# Patient Record
Sex: Female | Born: 1995 | Race: Black or African American | Hispanic: No | Marital: Single
Health system: Southern US, Community
[De-identification: ages and names within clinical notes are randomized; demographics above are authoritative.]

## PROBLEM LIST (undated history)

## (undated) DIAGNOSIS — J45909 Unspecified asthma, uncomplicated: Secondary | ICD-10-CM

## (undated) DIAGNOSIS — F329 Major depressive disorder, single episode, unspecified: Secondary | ICD-10-CM

## (undated) DIAGNOSIS — R519 Headache, unspecified: Secondary | ICD-10-CM

## (undated) DIAGNOSIS — F988 Other specified behavioral and emotional disorders with onset usually occurring in childhood and adolescence: Secondary | ICD-10-CM

## (undated) DIAGNOSIS — F32A Depression, unspecified: Secondary | ICD-10-CM

## (undated) DIAGNOSIS — F4310 Post-traumatic stress disorder, unspecified: Secondary | ICD-10-CM

## (undated) DIAGNOSIS — R413 Other amnesia: Secondary | ICD-10-CM

## (undated) DIAGNOSIS — R454 Irritability and anger: Secondary | ICD-10-CM

## (undated) DIAGNOSIS — D649 Anemia, unspecified: Secondary | ICD-10-CM

## (undated) DIAGNOSIS — S0990XA Unspecified injury of head, initial encounter: Secondary | ICD-10-CM

## (undated) DIAGNOSIS — F909 Attention-deficit hyperactivity disorder, unspecified type: Secondary | ICD-10-CM

## (undated) DIAGNOSIS — R51 Headache: Secondary | ICD-10-CM

## (undated) DIAGNOSIS — F419 Anxiety disorder, unspecified: Secondary | ICD-10-CM

## (undated) HISTORY — DX: Headache: R51

## (undated) HISTORY — DX: Headache, unspecified: R51.9

---
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
## 2015-03-09 ENCOUNTER — Encounter (HOSPITAL_COMMUNITY): Payer: Self-pay | Admitting: Emergency Medicine

## 2015-03-09 ENCOUNTER — Emergency Department (HOSPITAL_COMMUNITY)
Admission: EM | Admit: 2015-03-09 | Discharge: 2015-03-09 | Disposition: A | Discharge status: Home / Self Care | Payer: BLUE CROSS/BLUE SHIELD | Attending: Emergency Medicine | Admitting: Emergency Medicine

## 2015-03-09 DIAGNOSIS — Z87828 Personal history of other (healed) physical injury and trauma: Secondary | ICD-10-CM | POA: Insufficient documentation

## 2015-03-09 DIAGNOSIS — R0789 Other chest pain: Secondary | ICD-10-CM | POA: Diagnosis not present

## 2015-03-09 DIAGNOSIS — Z7951 Long term (current) use of inhaled steroids: Secondary | ICD-10-CM | POA: Insufficient documentation

## 2015-03-09 DIAGNOSIS — Z79899 Other long term (current) drug therapy: Secondary | ICD-10-CM | POA: Diagnosis not present

## 2015-03-09 DIAGNOSIS — F329 Major depressive disorder, single episode, unspecified: Secondary | ICD-10-CM | POA: Insufficient documentation

## 2015-03-09 DIAGNOSIS — F419 Anxiety disorder, unspecified: Secondary | ICD-10-CM | POA: Insufficient documentation

## 2015-03-09 DIAGNOSIS — F909 Attention-deficit hyperactivity disorder, unspecified type: Secondary | ICD-10-CM | POA: Insufficient documentation

## 2015-03-09 DIAGNOSIS — Z792 Long term (current) use of antibiotics: Secondary | ICD-10-CM | POA: Insufficient documentation

## 2015-03-09 DIAGNOSIS — Z862 Personal history of diseases of the blood and blood-forming organs and certain disorders involving the immune mechanism: Secondary | ICD-10-CM | POA: Diagnosis not present

## 2015-03-09 DIAGNOSIS — R079 Chest pain, unspecified: Secondary | ICD-10-CM | POA: Diagnosis present

## 2015-03-09 DIAGNOSIS — J45901 Unspecified asthma with (acute) exacerbation: Secondary | ICD-10-CM | POA: Insufficient documentation

## 2015-03-09 HISTORY — DX: Other amnesia: R41.3

## 2015-03-09 HISTORY — DX: Post-traumatic stress disorder, unspecified: F43.10

## 2015-03-09 HISTORY — DX: Unspecified injury of head, initial encounter: S09.90XA

## 2015-03-09 MED ORDER — ACETAMINOPHEN 325 MG PO TABS
650.0000 mg | ORAL_TABLET | Freq: Once | ORAL | Status: AC
  Administered 2015-03-09: 650 mg via ORAL
  Filled 2015-03-09: qty 2

## 2015-03-09 NOTE — ED Notes (Signed)
Patient reports chest pain is worse.  Patient reports she did smoke cigarette. Notified PA.
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
## 2015-03-09 NOTE — ED Provider Notes (Signed)
CSN: [id number]     Arrival date & time 03/09/15  0505 History   First MD Initiated Contact with Patient 03/09/15 0507     Chief Complaint  Patient presents with  . Chest Pain     (Consider location/radiation/quality/duration/timing/severity/associated sxs/prior Treatment) Patient is a 20 y.o. female presenting with chest pain. The history is provided by the patient and a parent. No language interpreter was used.  Chest Pain Pain location:  Substernal area Pain quality: aching   Pain radiates to the back: no   Associated symptoms: headache and shortness of breath   Associated symptoms: no abdominal pain, no fever, no nausea and not vomiting   Associated symptoms comment:  Patient reports onset of SOB with bilateral lower chest pain that started tonight when exposed to second hand smoke. She used her inhaler multiple times but reports it made it worse. No fever or cough. No passing out. She complains of headache. No vomiting.   Past Medical History  Diagnosis Date  . Depression   . Anger   . ADD (attention deficit disorder)   . Anemia   . Anxiety   . ADHD (attention deficit hyperactivity disorder)   . Asthma   . Headache   . PTSD (post-traumatic stress disorder)   . Memory loss   . Head trauma    History reviewed. No pertinent past surgical history. Family History  Problem Relation Age of Onset  . High blood pressure Mother   . High blood pressure Father   . Cancer Maternal Grandmother    Social History  Substance Use Topics  . Smoking status: Passive Smoke Exposure - Never Smoker  . Smokeless tobacco: Never Used     Comment: Step dad smokes  . Alcohol Use: No   OB History    Gravida Para Term Preterm AB TAB SAB Ectopic Multiple Living   0 0 0 0 0 0 0 0       Review of Systems  Constitutional: Negative for fever and chills.  HENT: Negative for congestion.   Respiratory: Positive for shortness of breath and wheezing.   Cardiovascular: Positive for chest pain.   Gastrointestinal: Negative.  Negative for nausea, vomiting and abdominal pain.  Musculoskeletal: Negative.  Negative for myalgias.  Neurological: Positive for headaches.      Allergies  Ethyl alcohol (skin cleanser); Other; and Tomato  Home Medications   Prior to Admission medications   Medication Sig Start Date End Date Taking? Authorizing Provider  albuterol (PROVENTIL HFA;VENTOLIN HFA) 108 (90 BASE) MCG/ACT inhaler Inhale 2 puffs into the lungs every 6 (six) hours as needed for wheezing or shortness of breath. 11/24/13   Shruti Anderson Malta, MD  beclomethasone (QVAR) 80 MCG/ACT inhaler Inhale 2 puffs into the lungs 2 (two) times daily. 11/24/13   Shruti Anderson Malta, MD  cetirizine (ZYRTEC) 10 MG tablet Take 1 tablet (10 mg total) by mouth daily. 12/23/13   Shruti Anderson Malta, MD  diclofenac (VOLTAREN) 75 MG EC tablet TK 1 T PO BID WF PRN 12/16/14   Historical Provider, MD  lamoTRIgine (LAMICTAL) 25 MG tablet  12/16/14   Historical Provider, MD  methylphenidate 36 MG PO CR tablet TK 2 TS PO QAM FOR ADHD 11/18/14   Historical Provider, MD  mupirocin ointment (BACTROBAN) 2 % Apply 1 application topically 3 (three) times daily. 12/01/14   Martinique Broman-Fulks, MD  ondansetron (ZOFRAN) 4 MG tablet Take 1 tablet (4 mg total) by mouth every 8 (eight) hours as needed for nausea or  vomiting. 12/12/14   Clayton Bibles, PA-C  traZODone (DESYREL) 50 MG tablet TK 1 TO 2 TS PO AT BEDTIME PRF SLEEP 12/16/14   Historical Provider, MD   BP 122/96 mmHg  Pulse 76  Temp(Src) 98.5 F (36.9 C) (Oral)  Resp 20  Wt 96.2 kg  SpO2 100% Physical Exam  Constitutional: She is oriented to person, place, and time. She appears well-developed and well-nourished. No distress.  HENT:  Head: Normocephalic.  Mouth/Throat: Oropharynx is clear and moist.  Eyes: Conjunctivae are normal.  Neck: Normal range of motion.  Cardiovascular: Normal rate.   No murmur heard. Pulmonary/Chest: Effort normal. She has no wheezes. She has no  rales. She exhibits tenderness (bilateral lower chest and substernal chest tenderness that is mild. ).  Abdominal: Soft. There is no tenderness. There is no rebound.  Musculoskeletal: Normal range of motion. She exhibits no edema.  Neurological: She is alert and oriented to person, place, and time. Coordination normal.  Skin: Skin is dry.    ED Course  Procedures (including critical care time) Labs Review Labs Reviewed - No data to display  Imaging Review No results found. I have personally reviewed and evaluated these images and lab results as part of my medical decision-making.   EKG Interpretation None      MDM   Final diagnoses:  None    1. SOB 2. Headache  Chart reviewed. Patient is treated for daily headaches per well documented history. No neurologic deficits. No further evaluation for headache necessary emergently.  The patient has normal vital signs, including 100% O2 saturation, no tachycardia or tachypnea. She is very comfortable appearing. Suspect component of anxiety. Will keep her in ED for period of observation prior to discharge for any deterioration.    Charlann Lange, PA-C 03/09/15 [id number]  Julianne Rice, MD 03/09/15 [phone]

## 2015-03-09 NOTE — Discharge Instructions (Signed)

## 2015-04-11 ENCOUNTER — Telehealth: Payer: Self-pay

## 2015-04-11 NOTE — Telephone Encounter (Signed)
Patient called stating that she needs a letter for school stating that she no longer has migraines. She states that this is very urgent.  [phone] or [phone]

## 2015-04-11 NOTE — Telephone Encounter (Signed)
Patient called back with the information that was requested.  [phone] or [place]  Email:[email or web address]   [address] [place], [place] [address]

## 2015-04-12 ENCOUNTER — Encounter: Payer: Self-pay | Admitting: Pediatrics

## 2015-04-13 ENCOUNTER — Emergency Department (HOSPITAL_COMMUNITY)
Admission: EM | Admit: 2015-04-13 | Discharge: 2015-04-13 | Disposition: A | Discharge status: Home / Self Care | Payer: BLUE CROSS/BLUE SHIELD | Attending: Emergency Medicine | Admitting: Emergency Medicine

## 2015-04-13 ENCOUNTER — Encounter (HOSPITAL_COMMUNITY): Payer: Self-pay

## 2015-04-13 DIAGNOSIS — Z862 Personal history of diseases of the blood and blood-forming organs and certain disorders involving the immune mechanism: Secondary | ICD-10-CM | POA: Diagnosis not present

## 2015-04-13 DIAGNOSIS — J45909 Unspecified asthma, uncomplicated: Secondary | ICD-10-CM | POA: Diagnosis not present

## 2015-04-13 DIAGNOSIS — Z792 Long term (current) use of antibiotics: Secondary | ICD-10-CM | POA: Insufficient documentation

## 2015-04-13 DIAGNOSIS — Z87828 Personal history of other (healed) physical injury and trauma: Secondary | ICD-10-CM | POA: Diagnosis not present

## 2015-04-13 DIAGNOSIS — F329 Major depressive disorder, single episode, unspecified: Secondary | ICD-10-CM | POA: Diagnosis not present

## 2015-04-13 DIAGNOSIS — J069 Acute upper respiratory infection, unspecified: Secondary | ICD-10-CM | POA: Insufficient documentation

## 2015-04-13 DIAGNOSIS — F909 Attention-deficit hyperactivity disorder, unspecified type: Secondary | ICD-10-CM | POA: Diagnosis not present

## 2015-04-13 DIAGNOSIS — F419 Anxiety disorder, unspecified: Secondary | ICD-10-CM | POA: Insufficient documentation

## 2015-04-13 DIAGNOSIS — Z79899 Other long term (current) drug therapy: Secondary | ICD-10-CM | POA: Insufficient documentation

## 2015-04-13 DIAGNOSIS — Z7951 Long term (current) use of inhaled steroids: Secondary | ICD-10-CM | POA: Diagnosis not present

## 2015-04-13 DIAGNOSIS — F4310 Post-traumatic stress disorder, unspecified: Secondary | ICD-10-CM | POA: Diagnosis not present

## 2015-04-13 DIAGNOSIS — R05 Cough: Secondary | ICD-10-CM | POA: Diagnosis present

## 2015-04-13 MED ORDER — AZITHROMYCIN 250 MG PO TABS: 250.0000 mg | ORAL_TABLET | Freq: Every day | ORAL | Status: DC

## 2015-04-13 MED ORDER — LORATADINE 10 MG PO TABS: 10.0000 mg | ORAL_TABLET | Freq: Every day | ORAL | Status: DC

## 2015-04-13 MED ORDER — BENZONATATE 100 MG PO CAPS: 200.0000 mg | ORAL_CAPSULE | Freq: Two times a day (BID) | ORAL | Status: DC | PRN

## 2015-04-13 NOTE — ED Provider Notes (Signed)
History  By signing my name below, I, Marlowe Kays, attest that this documentation has been prepared under the direction and in the presence of Rob [place], [place]. Electronically Signed: Marlowe Kays, ED Scribe. 04/13/2015. 10:37 AM.  Chief Complaint  Patient presents with  . Cough  . Sore Throat   The history is provided by the patient and medical records. No language interpreter was used.    HPI Comments:  Tina Hale is a 20 y.o. obese female who presents to the Emergency Department complaining of cough that began two months ago. She reports associated subjective fever, rhinorrhea and sore throat. Pt states she came to be seen today because her teacher suggested she be checked out. She has not taken anything for her symptoms but states she takes Zyrtec daily for allergies. She denies modifying factors. She denies nausea, vomiting. She states her PCP is Designer, [facility]/clothing.  Past Medical History  Diagnosis Date  . Depression   . Anger   . ADD (attention deficit disorder)   . Anemia   . Anxiety   . ADHD (attention deficit hyperactivity disorder)   . Asthma   . Headache   . PTSD (post-traumatic stress disorder)   . Memory loss   . Head trauma    History reviewed. No pertinent past surgical history. Family History  Problem Relation Age of Onset  . High blood pressure Mother   . High blood pressure Father   . Cancer Maternal Grandmother    Social History  Substance Use Topics  . Smoking status: Passive Smoke Exposure - Never Smoker  . Smokeless tobacco: Never Used     Comment: Step dad smokes  . Alcohol Use: No   OB History    Gravida Para Term Preterm AB TAB SAB Ectopic Multiple Living   0 0 0 0 0 0 0 0       Review of Systems  Constitutional: Positive for fever (subjective).  HENT: Positive for congestion and sore throat.   Respiratory: Positive for cough.   Gastrointestinal: Negative for nausea and vomiting.    Allergies  Ethyl alcohol (skin cleanser);  Other; and Tomato  Home Medications   Prior to Admission medications   Medication Sig Start Date End Date Taking? Authorizing Provider  albuterol (PROVENTIL HFA;VENTOLIN HFA) 108 (90 BASE) MCG/ACT inhaler Inhale 2 puffs into the lungs every 6 (six) hours as needed for wheezing or shortness of breath. 11/24/13   Shruti Anderson Malta, MD  beclomethasone (QVAR) 80 MCG/ACT inhaler Inhale 2 puffs into the lungs 2 (two) times daily. 11/24/13   Shruti Anderson Malta, MD  cetirizine (ZYRTEC) 10 MG tablet Take 1 tablet (10 mg total) by mouth daily. 12/23/13   Shruti Anderson Malta, MD  diclofenac (VOLTAREN) 75 MG EC tablet TK 1 T PO BID WF PRN 12/16/14   Historical Provider, MD  lamoTRIgine (LAMICTAL) 25 MG tablet  12/16/14   Historical Provider, MD  methylphenidate 36 MG PO CR tablet TK 2 TS PO QAM FOR ADHD 11/18/14   Historical Provider, MD  mupirocin ointment (BACTROBAN) 2 % Apply 1 application topically 3 (three) times daily. 12/01/14   Martinique Broman-Fulks, MD  ondansetron (ZOFRAN) 4 MG tablet Take 1 tablet (4 mg total) by mouth every 8 (eight) hours as needed for nausea or vomiting. 12/12/14   Clayton Bibles, PA-C  traZODone (DESYREL) 50 MG tablet TK 1 TO 2 TS PO AT BEDTIME PRF SLEEP 12/16/14   Historical Provider, MD   Triage Vitals: BP 112/50 mmHg  Pulse  91  Temp(Src) 98.2 F (36.8 C) (Oral)  Resp 16  Ht 5\' 5"  (1.651 m)  Wt 230 lb (104.327 kg)  BMI 38.27 kg/m2  SpO2 100% Physical Exam Physical Exam  Constitutional: Pt  is oriented to person, place, and time. Appears well-developed and well-nourished. No distress.  HENT:  Head: Normocephalic and atraumatic.  Right Ear: Tympanic membrane, external ear and ear canal normal.  Left Ear: Tympanic membrane, external ear and ear canal normal.  Nose: Mucosal edema and mild rhinorrhea present. No epistaxis. Right sinus exhibits no maxillary sinus tenderness and no frontal sinus tenderness. Left sinus exhibits no maxillary sinus tenderness and no frontal sinus tenderness.   Mouth/Throat: Uvula is midline and mucous membranes are normal. Mucous membranes are not pale and not cyanotic. No oropharyngeal exudate, posterior oropharyngeal edema, posterior oropharyngeal erythema or tonsillar abscesses.  Eyes: Conjunctivae are normal. Pupils are equal, round, and reactive to light.  Neck: Normal range of motion and full passive range of motion without pain.  Cardiovascular: Normal rate and intact distal pulses.   Pulmonary/Chest: Effort normal and breath sounds normal. No stridor.  Clear and equal breath sounds without focal wheezes, rhonchi, rales  Abdominal: Soft. Bowel sounds are normal. There is no tenderness.  Musculoskeletal: Normal range of motion.  Lymphadenopathy:    Pthas no cervical adenopathy.  Neurological: Pt is alert and oriented to person, place, and time.  Skin: Skin is warm and dry. No rash noted. Pt is not diaphoretic.  Psychiatric: Normal mood and affect.  Nursing note and vitals reviewed.   ED Course  Procedures (including critical care time) DIAGNOSTIC STUDIES: Oxygen Saturation is 100% on RA, normal by my interpretation.   COORDINATION OF CARE: 10:33 AM- Will prescribe Z-Pak and cough medication. Will prescribe Loratadine. Pt verbalizes understanding and agrees to plan.  Medications - No data to display   MDM   Final diagnoses:  URI (upper respiratory infection)     Patients symptoms are consistent with URI, likely viral etiology. Discussed that antibiotics are not indicated for viral infections, but given length of symptoms she may benefit from antibiotic trial. Pt will be discharged with symptomatic treatment.  Verbalizes understanding and is agreeable with plan. Pt is hemodynamically stable & in NAD prior to dc.   I personally performed the services described in this documentation, which was scribed in my presence. The recorded information has been reviewed and is accurate.       Montine Circle, PA-C 04/13/15  [place], MD 04/13/15 1538

## 2015-04-13 NOTE — Discharge Instructions (Signed)
Upper Respiratory Infection, Adult Most upper respiratory infections (URIs) are a viral infection of the air passages leading to the lungs. A URI affects the nose, throat, and upper air passages. The most common type of URI is nasopharyngitis and is typically referred to as "the common cold." URIs run their course and usually go away on their own. Most of the time, a URI does not require medical attention, but sometimes a bacterial infection in the upper airways can follow a viral infection. This is called a secondary infection. Sinus and middle ear infections are common types of secondary upper respiratory infections. Bacterial pneumonia can also complicate a URI. A URI can worsen asthma and chronic obstructive pulmonary disease (COPD). Sometimes, these complications can require emergency medical care and may be life threatening.  CAUSES Almost all URIs are caused by viruses. A virus is a type of germ and can spread from one person to another.  RISKS FACTORS You may be at risk for a URI if:   You smoke.   You have chronic heart or lung disease.  You have a weakened defense (immune) system.   You are very young or very old.   You have nasal allergies or asthma.  You work in crowded or poorly ventilated areas.  You work in health care facilities or schools. SIGNS AND SYMPTOMS  Symptoms typically develop 2-3 days after you come in contact with a cold virus. Most viral URIs last 7-10 days. However, viral URIs from the influenza virus (flu virus) can last 14-18 days and are typically more severe. Symptoms may include:   Runny or stuffy (congested) nose.   Sneezing.   Cough.   Sore throat.   Headache.   Fatigue.   Fever.   Loss of appetite.   Pain in your forehead, behind your eyes, and over your cheekbones (sinus pain).  Muscle aches.  DIAGNOSIS  Your health care provider may diagnose a URI by:  Physical exam.  Tests to check that your symptoms are not due to  another condition such as:  Strep throat.  Sinusitis.  Pneumonia.  Asthma. TREATMENT  A URI goes away on its own with time. It cannot be cured with medicines, but medicines may be prescribed or recommended to relieve symptoms. Medicines may help:  Reduce your fever.  Reduce your cough.  Relieve nasal congestion. HOME CARE INSTRUCTIONS   Take medicines only as directed by your health care provider.   Gargle warm saltwater or take cough drops to comfort your throat as directed by your health care provider.  Use a warm mist humidifier or inhale steam from a shower to increase air moisture. This may make it easier to breathe.  Drink enough fluid to keep your urine clear or pale yellow.   Eat soups and other clear broths and maintain good nutrition.   Rest as needed.   Return to work when your temperature has returned to normal or as your health care provider advises. You may need to stay home longer to avoid infecting others. You can also use a face mask and careful hand washing to prevent spread of the virus.  Increase the usage of your inhaler if you have asthma.   Do not use any tobacco products, including cigarettes, chewing tobacco, or electronic cigarettes. If you need help quitting, ask your health care provider. PREVENTION  The best way to protect yourself from getting a cold is to practice good hygiene.   Avoid oral or hand contact with people with cold   symptoms.   Wash your hands often if contact occurs.  There is no clear evidence that vitamin C, vitamin E, echinacea, or exercise reduces the chance of developing a cold. However, it is always recommended to get plenty of rest, exercise, and practice good nutrition.  SEEK MEDICAL CARE IF:   You are getting worse rather than better.   Your symptoms are not controlled by medicine.   You have chills.  You have worsening shortness of breath.  You have brown or red mucus.  You have yellow or brown nasal  discharge.  You have pain in your face, especially when you bend forward.  You have a fever.  You have swollen neck glands.  You have pain while swallowing.  You have white areas in the back of your throat. SEEK IMMEDIATE MEDICAL CARE IF:   You have severe or persistent:  Headache.  Ear pain.  Sinus pain.  Chest pain.  You have chronic lung disease and any of the following:  Wheezing.  Prolonged cough.  Coughing up blood.  A change in your usual mucus.  You have a stiff neck.  You have changes in your:  Vision.  Hearing.  Thinking.  Mood. MAKE SURE YOU:   Understand these instructions.  Will watch your condition.  Will get help right away if you are not doing well or get worse.   This information is not intended to replace advice given to you by your health care provider. Make sure you discuss any questions you have with your health care provider.   Document Released: 06/26/2000 Document Revised: 05/17/2014 Document Reviewed: 04/07/2013 Elsevier Interactive Patient Education 2016 Elsevier Inc.  

## 2015-04-13 NOTE — ED Notes (Signed)
Patient heere with cough, congestion and sore throat for several days, no distress

## 2015-04-13 NOTE — ED Notes (Signed)
Declined W/C at D/C and was escorted to lobby by RN. 

## 2015-05-10 ENCOUNTER — Ambulatory Visit: Payer: BLUE CROSS/BLUE SHIELD | Admitting: Pediatrics

## 2015-05-12 ENCOUNTER — Ambulatory Visit: Payer: BLUE CROSS/BLUE SHIELD | Admitting: Pediatrics

## 2015-07-17 ENCOUNTER — Emergency Department (HOSPITAL_COMMUNITY)
Admission: EM | Admit: 2015-07-17 | Discharge: 2015-07-17 | Disposition: A | Discharge status: Home / Self Care | Payer: BLUE CROSS/BLUE SHIELD | Attending: Emergency Medicine | Admitting: Emergency Medicine

## 2015-07-17 ENCOUNTER — Emergency Department (HOSPITAL_COMMUNITY): Payer: BLUE CROSS/BLUE SHIELD

## 2015-07-17 ENCOUNTER — Encounter (HOSPITAL_COMMUNITY): Payer: Self-pay | Admitting: Emergency Medicine

## 2015-07-17 DIAGNOSIS — Z79899 Other long term (current) drug therapy: Secondary | ICD-10-CM | POA: Diagnosis not present

## 2015-07-17 DIAGNOSIS — Z7722 Contact with and (suspected) exposure to environmental tobacco smoke (acute) (chronic): Secondary | ICD-10-CM | POA: Diagnosis not present

## 2015-07-17 DIAGNOSIS — F329 Major depressive disorder, single episode, unspecified: Secondary | ICD-10-CM | POA: Diagnosis not present

## 2015-07-17 DIAGNOSIS — J020 Streptococcal pharyngitis: Secondary | ICD-10-CM | POA: Insufficient documentation

## 2015-07-17 DIAGNOSIS — J45909 Unspecified asthma, uncomplicated: Secondary | ICD-10-CM | POA: Diagnosis not present

## 2015-07-17 DIAGNOSIS — J029 Acute pharyngitis, unspecified: Secondary | ICD-10-CM | POA: Diagnosis present

## 2015-07-17 DIAGNOSIS — R040 Epistaxis: Secondary | ICD-10-CM

## 2015-07-17 LAB — I-STAT CHEM 8, ED
BUN: 10 mg/dL (ref 6–20)
Calcium, Ion: 1.22 mmol/L (ref 1.13–1.30)
Chloride: 105 mmol/L (ref 101–111)
Creatinine, Ser: 0.7 mg/dL (ref 0.44–1.00)
Glucose, Bld: 73 mg/dL (ref 65–99)
HCT: 37 % (ref 36.0–46.0)
Hemoglobin: 12.6 g/dL (ref 12.0–15.0)
Potassium: 4.6 mmol/L (ref 3.5–5.1)
Sodium: 141 mmol/L (ref 135–145)
TCO2: 27 mmol/L (ref 0–100)

## 2015-07-17 LAB — RAPID STREP SCREEN (MED CTR MEBANE ONLY): Streptococcus, Group A Screen (Direct): NEGATIVE

## 2015-07-17 MED ORDER — OXYMETAZOLINE HCL 0.05 % NA SOLN: 1.0000 | Freq: Two times a day (BID) | NASAL | Status: DC

## 2015-07-17 MED ORDER — DEXAMETHASONE SODIUM PHOSPHATE 10 MG/ML IJ SOLN
10.0000 mg | Freq: Once | INTRAMUSCULAR | Status: AC
  Administered 2015-07-17: 10 mg via INTRAMUSCULAR
  Filled 2015-07-17: qty 1

## 2015-07-17 NOTE — ED Notes (Signed)
Patient transported to X-ray 

## 2015-07-17 NOTE — ED Notes (Addendum)
Per EMS. Pt had a nosebleed yesterday that lasted an hour. Since then has had several episodes of emesis with bright red blood accompanied by bilateral upper abd pain. Pt also complains of throat discomfort

## 2015-07-17 NOTE — ED Notes (Signed)
Pt reports that she has not bee vomiting blood, but has been spitting up blood since nose bleed.  Pt c/o sore throat x 1 week.  Pain score 10/10.

## 2015-07-17 NOTE — ED Notes (Signed)
Bed: WTR6 Expected date:  Expected time:  Means of arrival:  Comments: 72F/hematemesis

## 2015-07-17 NOTE — Discharge Instructions (Signed)
Nosebleed Nosebleeds are common. They are due to a crack in the inside lining of your nose (mucous membrane) or from a small blood vessel that starts to bleed. Nosebleeds can be caused by many conditions, such as injury, infections, dry mucous membranes or dry climate, medicines, nose picking, and home heating and cooling systems. Most nosebleeds come from blood vessels in the front of your nose. HOME CARE INSTRUCTIONS   Try controlling your nosebleed by pinching your nostrils gently and continuously for at least 10 minutes.  Avoid blowing or sniffing your nose for a number of hours after having a nosebleed.  Do not put gauze inside your nose yourself. If your nose was packed by your health care provider, try to maintain the pack inside of your nose until your health care provider removes it.  If a gauze pack was used and it starts to fall out, gently replace it or cut off the end of it.  If a balloon catheter was used to pack your nose, do not cut or remove it unless your health care provider has instructed you to do that.  Avoid lying down while you are having a nosebleed. Sit up and lean forward.  Use a nasal spray decongestant to help with a nosebleed as directed by your health care provider.  Do not use petroleum jelly or mineral oil in your nose. These can drip into your lungs.  Maintain humidity in your home by using less air conditioning or by using a humidifier.  Aspirinand blood thinners make bleeding more likely. If you are prescribed these medicines and you suffer from nosebleeds, ask your health care provider if you should stop taking the medicines or adjust the dose. Do not stop medicines unless directed by your health care provider  Resume your normal activities as you are able, but avoid straining, lifting, or bending at the waist for several days.  If your nosebleed was caused by dry mucous membranes, use over-the-counter saline nasal spray or gel. This will keep the  mucous membranes moist and allow them to heal. If you must use a lubricant, choose the water-soluble variety. Use it only sparingly, and do not use it within several hours of lying down.  Keep all follow-up visits as directed by your health care provider. This is important. SEEK MEDICAL CARE IF:  You have a fever.  You get frequent nosebleeds.  You are getting nosebleeds more often. SEEK IMMEDIATE MEDICAL CARE IF:  Your nosebleed lasts longer than 20 minutes.  Your nosebleed occurs after an injury to your face, and your nose looks crooked or broken.  You have unusual bleeding from other parts of your body.  You have unusual bruising on other parts of your body.  You feel light-headed or you faint.  You become sweaty.  You vomit blood.  Your nosebleed occurs after a head injury.   This information is not intended to replace advice given to you by your health care provider. Make sure you discuss any questions you have with your health care provider.   Document Released: 10/10/2004 Document Revised: 01/21/2014 Document Reviewed: 08/16/2013 Elsevier Interactive Patient Education 2016 Elsevier Inc.  Sore Throat A sore throat is pain, burning, irritation, or scratchiness of the throat. There is often pain or tenderness when swallowing or talking. A sore throat may be accompanied by other symptoms, such as coughing, sneezing, fever, and swollen neck glands. A sore throat is often the first sign of another sickness, such as a cold, flu, strep throat,  or mononucleosis (commonly known as mono). Most sore throats go away without medical treatment. CAUSES  The most common causes of a sore throat include:  A viral infection, such as a cold, flu, or mono.  A bacterial infection, such as strep throat, tonsillitis, or whooping cough.  Seasonal allergies.  Dryness in the air.  Irritants, such as smoke or pollution.  Gastroesophageal reflux disease (GERD). HOME CARE INSTRUCTIONS    Only take over-the-counter medicines as directed by your caregiver.  Drink enough fluids to keep your urine clear or pale yellow.  Rest as needed.  Try using throat sprays, lozenges, or sucking on hard candy to ease any pain (if older than 4 years or as directed).  Sip warm liquids, such as broth, herbal tea, or warm water with honey to relieve pain temporarily. You may also eat or drink cold or frozen liquids such as frozen ice pops.  Gargle with salt water (mix 1 tsp salt with 8 oz of water).  Do not smoke and avoid secondhand smoke.  Put a cool-mist humidifier in your bedroom at night to moisten the air. You can also turn on a hot shower and sit in the bathroom with the door closed for 5-10 minutes. SEEK IMMEDIATE MEDICAL CARE IF:  You have difficulty breathing.  You are unable to swallow fluids, soft foods, or your saliva.  You have increased swelling in the throat.  Your sore throat does not get better in 7 days.  You have nausea and vomiting.  You have a fever or persistent symptoms for more than 2-3 days.  You have a fever and your symptoms suddenly get worse. MAKE SURE YOU:   Understand these instructions.  Will watch your condition.  Will get help right away if you are not doing well or get worse.   This information is not intended to replace advice given to you by your health care provider. Make sure you discuss any questions you have with your health care provider.   Your strep test today was negative. You do not need antibiotics. Your infection is likely viral in nature. Use Afrin for nosebleed. Continue home ibuprofen and tylenol for sore throat. Follow up with your primary care provider if your symptoms do not improve. Return to the ED if you experience difficulty breathing, difficulty swallowing, increased fevers, blood in vomit.

## 2015-07-18 NOTE — ED Provider Notes (Signed)
CSN: [id number]     Arrival date & time 07/17/15  1152 History   First MD Initiated Contact with Patient 07/17/15 1409     Chief Complaint  Patient presents with  . Hematemesis     (Consider location/radiation/quality/duration/timing/severity/associated sxs/prior Treatment) HPI   Tina Hale is a 20 year old here with a past medical history of anxiety, ADD who presents the ED today complaining of sore throat and nosebleed. Patient states that for the last week she has been experiencing URI symptoms including rhinorrhea, dry cough, sore throat. She states she has also felt hot at times but is not sure if she ran a fever. Patient states that her throat is very sore and she feels like there is something stuck in it. She reports that it is painful to swallow. Patient also states that yesterday she developed a severe nosebleed that bled for about one hour. During and after the nosebleed patient reports that when she coughed she coughed up some blood. Patient states that she has tried taking Advil and Tylenol for her symptoms without relief. She has also tried drinking hot tea and gargling with salt water without relief. She denies any vomiting, otalgia, difficulty breathing, difficulty swallowing.  Past Medical History  Diagnosis Date  . Depression   . Anger   . ADD (attention deficit disorder)   . Anemia   . Anxiety   . ADHD (attention deficit hyperactivity disorder)   . Asthma   . Headache   . PTSD (post-traumatic stress disorder)   . Memory loss   . Head trauma    History reviewed. No pertinent past surgical history. Family History  Problem Relation Age of Onset  . High blood pressure Mother   . High blood pressure Father   . Cancer Maternal Grandmother    Social History  Substance Use Topics  . Smoking status: Passive Smoke Exposure - Never Smoker  . Smokeless tobacco: Never Used     Comment: Step dad smokes  . Alcohol Use: No   OB History    Gravida Para Term Preterm AB TAB  SAB Ectopic Multiple Living   0 0 0 0 0 0 0 0       Review of Systems  All other systems reviewed and are negative.     Allergies  Ethyl alcohol (skin cleanser); Other; and Tomato  Home Medications   Prior to Admission medications   Medication Sig Start Date End Date Taking? Authorizing Provider  albuterol (PROVENTIL HFA;VENTOLIN HFA) 108 (90 BASE) MCG/ACT inhaler Inhale 2 puffs into the lungs every 6 (six) hours as needed for wheezing or shortness of breath. 11/24/13  Yes Ok Edwards, MD  beclomethasone (QVAR) 80 MCG/ACT inhaler Inhale 2 puffs into the lungs 2 (two) times daily. 11/24/13  Yes Shruti Anderson [place], MD  cetirizine (ZYRTEC) 10 MG tablet Take 1 tablet (10 mg total) by mouth daily. 12/23/13  Yes Ok Edwards, MD  lamoTRIgine (LAMICTAL) 100 MG tablet Take 100 mg by mouth daily. 07/11/15  Yes Historical Provider, MD  methylphenidate (RITALIN) 10 MG tablet Take 10 mg by mouth daily at 2 PM. 07/11/15  Yes Historical Provider, MD  methylphenidate 36 MG PO CR tablet Take 2 tablets (72mg ) by mouth every morning. 11/18/14  Yes Historical Provider, MD  traZODone (DESYREL) 50 MG tablet Take 50-100mg  by mouth at bedtime as needed for sleep 12/16/14  Yes Historical Provider, MD  azithromycin (ZITHROMAX) 250 MG tablet Take 1 tablet (250 mg total) by mouth daily. Take first  2 tablets together, then 1 every day until finished. Patient not taking: Reported on 07/17/2015 04/13/15   Montine Circle, PA-C  benzonatate (TESSALON) 100 MG capsule Take 2 capsules (200 mg total) by mouth 2 (two) times daily as needed for cough. Patient not taking: Reported on 07/17/2015 04/13/15   Montine Circle, PA-C  loratadine (CLARITIN) 10 MG tablet Take 1 tablet (10 mg total) by mouth daily. Patient not taking: Reported on 07/17/2015 04/13/15   Montine Circle, PA-C  oxymetazoline (AFRIN NASAL SPRAY) 0.05 % nasal spray Place 1 spray into both nostrils 2 (two) times daily. 07/17/15   Canton Yearby Tripp Brilee [place], PA-C   BP  108/72 mmHg  Pulse 80  Temp(Src) 97.7 F (36.5 C) (Oral)  Resp 16  SpO2 100% Physical Exam  Constitutional: She is oriented to person, place, and time. She appears well-developed and well-nourished. No distress.  HENT:  Head: Normocephalic and atraumatic.  Mouth/Throat: Mucous membranes are normal. No trismus in the jaw. No uvula swelling. Posterior oropharyngeal erythema present. No oropharyngeal exudate, posterior oropharyngeal edema or tonsillar abscesses.  Small amount of dried blood in left nare  Eyes: Conjunctivae are normal. Right eye exhibits no discharge. Left eye exhibits no discharge. No scleral icterus.  Cardiovascular: Normal rate.   Pulmonary/Chest: Effort normal and breath sounds normal. No respiratory distress. She has no wheezes. She has no rales. She exhibits no tenderness.  Lymphadenopathy:    She has cervical adenopathy ( mild).  Neurological: She is alert and oriented to person, place, and time. Coordination normal.  Skin: Skin is warm and dry. No rash noted. She is not diaphoretic. No erythema. No pallor.  Psychiatric: She has a normal mood and affect. Her behavior is normal.  Nursing note and vitals reviewed.   ED Course  Procedures (including critical care time) Labs Review Labs Reviewed  RAPID STREP SCREEN (NOT AT [facility])  CULTURE, GROUP A STREP ([place])  I-STAT CHEM 8, ED    Imaging Review Dg Chest 2 View  07/17/2015  CLINICAL DATA:  Nosebleed and throat discomfort.  History of asthma. EXAM: CHEST  2 VIEW COMPARISON:  03/31/2014 FINDINGS: Lungs are somewhat hypoinflated and otherwise clear. Cardiomediastinal silhouette, bones and soft tissues are normal. IMPRESSION: Hypoinflation without acute cardiopulmonary disease. Electronically Signed   By: Marin Olp M.D.   On: 07/17/2015 15:55   I have personally reviewed and evaluated these images and lab results as part of my medical decision-making.   EKG Interpretation None      MDM   Final diagnoses:   Viral pharyngitis  Epistaxis    Otherwise healthy 20 y.o F presents to the ED c/o sore throat onset 1 week ago. Pt also had nosebleed yesterday that lasted 1 hour. No active bleeding now. Pt states she coughed up blood after the nose bleed. This was likely drainage from the epistaxis vs lung origin. CXR negative. Hgb stable. Pt appears well in the ED, non-toxic and non-septic appearing. Pt has mild cervical adenopathy and dysphagia. Rapid strep is negative. Likely viral pharyngitis. Pt given decadron injection in the ED for symptomatic improvement. No abx indicated at this time. Will d/c with prescription for Afrin. Continue home tylenol prn. Follow up with PCP. Return precautions outlined in patient discharge instructions.      Dondra Spry [place], PA-C [id number]  David Glick, MD [id number]

## 2015-07-19 LAB — CULTURE, GROUP A STREP (THRC)

## 2015-07-27 ENCOUNTER — Ambulatory Visit (INDEPENDENT_AMBULATORY_CARE_PROVIDER_SITE_OTHER): Payer: BLUE CROSS/BLUE SHIELD | Admitting: Certified Nurse Midwife

## 2015-07-27 DIAGNOSIS — Z113 Encounter for screening for infections with a predominantly sexual mode of transmission: Secondary | ICD-10-CM

## 2015-07-27 DIAGNOSIS — Z01419 Encounter for gynecological examination (general) (routine) without abnormal findings: Secondary | ICD-10-CM

## 2015-07-27 NOTE — Addendum Note (Signed)
Addended by: Lewie Loron D on: 07/27/2015 12:43 PM   Modules accepted: Orders

## 2015-07-27 NOTE — Progress Notes (Deleted)
Patient ID: Tina Hale, female   DOB: 1995/02/10, 20 y.o.   MRN: [id number]   Subjective:        Tina Hale is a 20 y.o. female here for a routine exam.  Current complaints: ***.    Personal health questionnaire:  Is patient Tina Hale, have a family history of breast and/or ovarian cancer: {YES NO:22349} Is there a family history of uterine cancer diagnosed at age < 8, gastrointestinal cancer, urinary tract cancer, family member who is a Field seismologist syndrome-associated carrier: {YES NO:22349} Is the patient overweight and hypertensive, family history of diabetes, personal history of gestational diabetes, preeclampsia or PCOS: {YES NO:22349} Is patient over 38, have PCOS,  family history of premature CHD under age 80, diabetes, smoke, have hypertension or peripheral artery disease:  {YES NO:22349} At any time, has a partner hit, kicked or otherwise hurt or frightened you?: {YES NO:22349} Over the past 2 weeks, have you felt down, depressed or hopeless?: {YES NO:22349} Over the past 2 weeks, have you felt little interest or pleasure in doing things?:{M;yes/no/sometimes:15259}   Gynecologic History No LMP recorded. Patient has had an implant. Contraception: {method:5051} Last Pap: ***. Results were: {norm/abn:16337} Last mammogram: ***. Results were: {norm/abn:16337}  Obstetric History OB History  Gravida Para Term Preterm AB SAB TAB Ectopic Multiple Living  0 0 0 0 0 0 0 0          Past Medical History  Diagnosis Date  . Depression   . Anger   . ADD (attention deficit disorder)   . Anemia   . Anxiety   . ADHD (attention deficit hyperactivity disorder)   . Asthma   . Headache   . PTSD (post-traumatic stress disorder)   . Memory loss   . Head trauma     No past surgical history on file.   Current outpatient prescriptions:  .  albuterol (PROVENTIL HFA;VENTOLIN HFA) 108 (90 BASE) MCG/ACT inhaler, Inhale 2 puffs into the lungs every 6 (six) hours as needed for wheezing  or shortness of breath., Disp: 1 Inhaler, Rfl: 1 .  azithromycin (ZITHROMAX) 250 MG tablet, Take 1 tablet (250 mg total) by mouth daily. Take first 2 tablets together, then 1 every day until finished. (Patient not taking: Reported on 07/17/2015), Disp: 6 tablet, Rfl: 0 .  beclomethasone (QVAR) 80 MCG/ACT inhaler, Inhale 2 puffs into the lungs 2 (two) times daily., Disp: 1 Inhaler, Rfl: 3 .  benzonatate (TESSALON) 100 MG capsule, Take 2 capsules (200 mg total) by mouth 2 (two) times daily as needed for cough. (Patient not taking: Reported on 07/17/2015), Disp: 20 capsule, Rfl: 0 .  cetirizine (ZYRTEC) 10 MG tablet, Take 1 tablet (10 mg total) by mouth daily., Disp: 30 tablet, Rfl: 3 .  lamoTRIgine (LAMICTAL) 100 MG tablet, Take 100 mg by mouth daily., Disp: , Rfl: 1 .  loratadine (CLARITIN) 10 MG tablet, Take 1 tablet (10 mg total) by mouth daily. (Patient not taking: Reported on 07/17/2015), Disp: 30 tablet, Rfl: 0 .  methylphenidate (RITALIN) 10 MG tablet, Take 10 mg by mouth daily at 2 PM., Disp: , Rfl: 0 .  methylphenidate 36 MG PO CR tablet, Take 2 tablets (72mg ) by mouth every morning., Disp: , Rfl: 0 .  oxymetazoline (AFRIN NASAL SPRAY) 0.05 % nasal spray, Place 1 spray into both nostrils 2 (two) times daily., Disp: 30 mL, Rfl: 0 .  traZODone (DESYREL) 50 MG tablet, Take 50-100mg  by mouth at bedtime as needed for sleep, Disp: , Rfl: 0 Allergies  Allergen Reactions  . Ethyl Alcohol (Skin Cleanser) Other (See Comments)    burns  . Other Other (See Comments)    Rubbing Alcohol : Makes skin red.  . Tomato Other (See Comments)    Acid reflux    Social History  Substance Use Topics  . Smoking status: Passive Smoke Exposure - Never Smoker  . Smokeless tobacco: Never Used     Comment: Step dad smokes  . Alcohol Use: No    Family History  Problem Relation Age of Onset  . High blood pressure Mother   . High blood pressure Father   . Cancer Maternal Grandmother       Review of  Systems  Constitutional: negative for fatigue and weight loss Respiratory: negative for cough and wheezing Cardiovascular: negative for chest pain, fatigue and palpitations Gastrointestinal: negative for abdominal pain and change in bowel habits Musculoskeletal:negative for myalgias Neurological: negative for gait problems and tremors Behavioral/Psych: negative for abusive relationship, depression Endocrine: negative for temperature intolerance   Genitourinary:negative for abnormal menstrual periods, genital lesions, hot flashes, sexual problems and vaginal discharge Integument/breast: negative for breast lump, breast tenderness, nipple discharge and skin lesion(s)    Objective:       There were no vitals taken for this visit. General:   alert  Skin:   no rash or abnormalities  Lungs:   clear to auscultation bilaterally  Heart:   regular rate and rhythm, S1, S2 normal, no murmur, click, rub or gallop  Breasts:   normal without suspicious masses, skin or nipple changes or axillary nodes  Abdomen:  normal findings: no organomegaly, soft, non-tender and no hernia  Pelvis:  External genitalia: normal general appearance Urinary system: urethral meatus normal and bladder without fullness, nontender Vaginal: normal without tenderness, induration or masses Cervix: normal appearance Adnexa: normal bimanual exam Uterus: anteverted and non-tender, normal size   Lab Review Urine pregnancy test Labs reviewed {YES NO:22349} Radiologic studies reviewed {YES NO:22349}  ***% of *** min visit spent on counseling and coordination of care.   Assessment:    Healthy female exam.    Plan:    {plan:19193}   No orders of the defined types were placed in this encounter.   No orders of the defined types were placed in this encounter.   Need to obtain previous records Possible management options include:*** Follow up as needed. ***

## 2015-07-27 NOTE — Progress Notes (Signed)
Patient ID: Tina Hale, female   DOB: 1995-11-27, 20 y.o.   MRN: [id number]    Subjective:      Tina Hale is a 20 y.o. female here for a routine exam.  Current complaints: none, desires full STD screening exam.  Has been sexually active 1X, reported 4 months ago.  Has never had STD screening examination.  Has had Nexplanon for 4 years.  Does have a hx of migraine HA.  Lives with her mother.  Desires to have Nexplanon out and replaced with a new one.  Periods are every other month and last 3 days, normal; denies any heavy bleeding/clots.      Personal health questionnaire:  Is patient [name], have a family history of breast and/or ovarian cancer: no Is there a family history of uterine cancer diagnosed at age < 62, gastrointestinal cancer, urinary tract cancer, family member who is a Field seismologist syndrome-associated carrier: no Is the patient overweight and hypertensive, family history of diabetes, personal history of gestational diabetes, preeclampsia or PCOS: yes Is patient over 65, have PCOS,  family history of premature CHD under age 82, diabetes, smoke, have hypertension or peripheral artery disease:  no At any time, has a partner hit, kicked or otherwise hurt or frightened you?: no Over the past 2 weeks, have you felt down, depressed or hopeless?: yes; on medication Over the past 2 weeks, have you felt little interest or pleasure in doing things?:no   Gynecologic History No LMP recorded. Patient has had an implant. Contraception: Nexplanon Last Pap: N/A.  Last mammogram: N/A.   Obstetric History OB History  Gravida Para Term Preterm AB SAB TAB Ectopic Multiple Living  0 0 0 0 0 0 0 0          Past Medical History  Diagnosis Date  . Depression   . Anger   . ADD (attention deficit disorder)   . Anemia   . Anxiety   . ADHD (attention deficit hyperactivity disorder)   . Asthma   . Headache   . PTSD (post-traumatic stress disorder)   . Memory loss   . Head trauma      No past surgical history on file.   Current outpatient prescriptions:  .  albuterol (PROVENTIL HFA;VENTOLIN HFA) 108 (90 BASE) MCG/ACT inhaler, Inhale 2 puffs into the lungs every 6 (six) hours as needed for wheezing or shortness of breath., Disp: 1 Inhaler, Rfl: 1 .  azithromycin (ZITHROMAX) 250 MG tablet, Take 1 tablet (250 mg total) by mouth daily. Take first 2 tablets together, then 1 every day until finished. (Patient not taking: Reported on 07/17/2015), Disp: 6 tablet, Rfl: 0 .  beclomethasone (QVAR) 80 MCG/ACT inhaler, Inhale 2 puffs into the lungs 2 (two) times daily., Disp: 1 Inhaler, Rfl: 3 .  benzonatate (TESSALON) 100 MG capsule, Take 2 capsules (200 mg total) by mouth 2 (two) times daily as needed for cough. (Patient not taking: Reported on 07/17/2015), Disp: 20 capsule, Rfl: 0 .  cetirizine (ZYRTEC) 10 MG tablet, Take 1 tablet (10 mg total) by mouth daily., Disp: 30 tablet, Rfl: 3 .  lamoTRIgine (LAMICTAL) 100 MG tablet, Take 100 mg by mouth daily., Disp: , Rfl: 1 .  loratadine (CLARITIN) 10 MG tablet, Take 1 tablet (10 mg total) by mouth daily. (Patient not taking: Reported on 07/17/2015), Disp: 30 tablet, Rfl: 0 .  methylphenidate (RITALIN) 10 MG tablet, Take 10 mg by mouth daily at 2 PM., Disp: , Rfl: 0 .  methylphenidate 36 MG  PO CR tablet, Take 2 tablets (72mg ) by mouth every morning., Disp: , Rfl: 0 .  oxymetazoline (AFRIN NASAL SPRAY) 0.05 % nasal spray, Place 1 spray into both nostrils 2 (two) times daily., Disp: 30 mL, Rfl: 0 .  traZODone (DESYREL) 50 MG tablet, Take 50-100mg  by mouth at bedtime as needed for sleep, Disp: , Rfl: 0 Allergies  Allergen Reactions  . Ethyl Alcohol (Skin Cleanser) Other (See Comments)    burns  . Other Other (See Comments)    Rubbing Alcohol : Makes skin red.  . Tomato Other (See Comments)    Acid reflux    Social History  Substance Use Topics  . Smoking status: Passive Smoke Exposure - Never Smoker  . Smokeless tobacco: Never Used      Comment: Step dad smokes  . Alcohol Use: No    Family History  Problem Relation Age of Onset  . High blood pressure Mother   . High blood pressure Father   . Cancer Maternal Grandmother       Review of Systems  Constitutional: negative for fatigue and weight loss Respiratory: negative for cough and wheezing Cardiovascular: negative for chest pain, fatigue and palpitations Gastrointestinal: negative for abdominal pain and change in bowel habits Musculoskeletal:negative for myalgias Neurological: negative for gait problems and tremors Behavioral/Psych: negative for abusive relationship, depression Endocrine: negative for temperature intolerance   Genitourinary:negative for abnormal menstrual periods, genital lesions, hot flashes, sexual problems and vaginal discharge Integument/breast: negative for breast lump, breast tenderness, nipple discharge and skin lesion(s)    Objective:       There were no vitals taken for this visit. General:   alert  Skin:   no rash or abnormalities  Lungs:   clear to auscultation bilaterally  Heart:   regular rate and rhythm, S1, S2 normal, no murmur, click, rub or gallop  Breasts:   deferred  Abdomen:  normal findings: no organomegaly, soft, non-tender and no hernia obese  Pelvis:  External genitalia: normal general appearance Urinary system: urethral meatus normal and bladder without fullness, nontender Vaginal: normal without tenderness, induration or masses    Lab Review Urine pregnancy test Labs reviewed yes Radiologic studies reviewed no  50% of 30 min visit spent on counseling and coordination of care.   Assessment:    Healthy female exam.   STD screening exam  Contraception management  Plan:    Education reviewed: calcium supplements, depression evaluation, low fat, low cholesterol diet, safe sex/STD prevention, self breast exams, skin cancer screening and weight bearing exercise. Contraception: Nexplanon. Follow up in: 1  month. for Nexplanon removal and reinsertion.   No orders of the defined types were placed in this encounter.   Orders Placed This Encounter  Procedures  . Hepatitis B surface antigen  . RPR  . Hepatitis C antibody  . HIV antibody   Need to obtain previous records Possible management options include: IUD

## 2015-07-28 LAB — RPR: RPR Ser Ql: NONREACTIVE

## 2015-07-28 LAB — HIV ANTIBODY (ROUTINE TESTING W REFLEX): HIV SCREEN 4TH GENERATION: NONREACTIVE

## 2015-07-28 LAB — HEPATITIS C ANTIBODY

## 2015-07-28 LAB — HEPATITIS B SURFACE ANTIGEN: HEP B S AG: NEGATIVE

## 2015-08-01 ENCOUNTER — Ambulatory Visit (INDEPENDENT_AMBULATORY_CARE_PROVIDER_SITE_OTHER): Payer: BLUE CROSS/BLUE SHIELD | Admitting: Certified Nurse Midwife

## 2015-08-01 VITALS — BP 96/67 | HR 92 | Temp 98.5°F | Wt 215.0 lb

## 2015-08-01 DIAGNOSIS — Z3049 Encounter for surveillance of other contraceptives: Secondary | ICD-10-CM

## 2015-08-01 DIAGNOSIS — Z3202 Encounter for pregnancy test, result negative: Secondary | ICD-10-CM

## 2015-08-01 DIAGNOSIS — Z3046 Encounter for surveillance of implantable subdermal contraceptive: Secondary | ICD-10-CM | POA: Insufficient documentation

## 2015-08-01 DIAGNOSIS — Z01812 Encounter for preprocedural laboratory examination: Secondary | ICD-10-CM

## 2015-08-01 DIAGNOSIS — Z30017 Encounter for initial prescription of implantable subdermal contraceptive: Secondary | ICD-10-CM

## 2015-08-01 LAB — NUSWAB VG, CANDIDA 6SP
CANDIDA KRUSEI, NAA: NEGATIVE
CANDIDA PARAPSILOSIS, NAA: NEGATIVE
CANDIDA TROPICALIS, NAA: NEGATIVE
Candida albicans, NAA: NEGATIVE
Candida glabrata, NAA: NEGATIVE
Candida lusitaniae, NAA: NEGATIVE
Trich vag by NAA: NEGATIVE

## 2015-08-01 LAB — POCT URINE PREGNANCY: PREG TEST UR: NEGATIVE

## 2015-08-01 NOTE — Progress Notes (Signed)
Patient ID: Tina Hale, female   DOB: [birth year], 20 y.o.   MRN: [id number]  Nexplanon Procedure Note    PROCEDURE: Nexplanon removal and placement Performing Provider: Kandis Cocking CNM   Patient education prior to procedure, explained risk, benefits of Nexplanon, reviewed alternative options. Patient reported understanding. Gave consent to continue with procedure.  Patient had Nexplanon inserted in [place] 4 years ago. Desires removal today w/ reinsertion  PROCEDURE:  Pregnancy Text :  not indicated Site (check):      left arm         Sterile Preparation:   Betadinex3 Lot # [id number] Expiration Date 08/2017    The patient's left arm was palpated and the implant device located. The area was prepped with Betadinex3. The distal end of the device was palpated and 1.5 cc of 1% lidocaine without epinephrine was injected. A 1.5 mm incision was made. Any fibrotic tissue was carefully dissected away using blunt and/or sharp dissection. The device was removed in an intact manner. Nexplanon that was removed was about 2 cm from elbow joint.  Patient states that when she had it placed she was sitting up and wondered why we did not do the same technique.  Proper technique was discussed with patient.  Nexplanon device was fractured in 2 places, but removed intact.    Insertion site was the same as the removal site. Nexplanon  was inserted subcutaneously.Needle was removed from the insertion site. Nexplanon capsule was palpated by provider and patient to assure satisfactory placement. Dressing applied.  Follow up: The patient tolerated the procedure well without complications.  Standard post-procedure care is explained and return precautions are given.  Kandis Cocking CNM

## 2015-08-03 ENCOUNTER — Telehealth: Payer: Self-pay | Admitting: *Deleted

## 2015-08-03 NOTE — Telephone Encounter (Signed)
See phone note for this encounter. 

## 2016-04-04 ENCOUNTER — Ambulatory Visit: Payer: Self-pay | Admitting: Obstetrics and Gynecology

## 2016-04-18 ENCOUNTER — Ambulatory Visit: Payer: Self-pay | Admitting: Obstetrics & Gynecology

## 2016-05-01 ENCOUNTER — Ambulatory Visit: Payer: Self-pay | Admitting: Obstetrics & Gynecology

## 2016-05-05 ENCOUNTER — Encounter (HOSPITAL_COMMUNITY): Payer: Self-pay | Admitting: *Deleted

## 2016-05-05 DIAGNOSIS — J45909 Unspecified asthma, uncomplicated: Secondary | ICD-10-CM | POA: Diagnosis not present

## 2016-05-05 DIAGNOSIS — Z79899 Other long term (current) drug therapy: Secondary | ICD-10-CM | POA: Diagnosis not present

## 2016-05-05 DIAGNOSIS — F909 Attention-deficit hyperactivity disorder, unspecified type: Secondary | ICD-10-CM | POA: Insufficient documentation

## 2016-05-05 DIAGNOSIS — J9859 Other diseases of mediastinum, not elsewhere classified: Secondary | ICD-10-CM | POA: Insufficient documentation

## 2016-05-05 DIAGNOSIS — R0602 Shortness of breath: Secondary | ICD-10-CM | POA: Diagnosis not present

## 2016-05-05 DIAGNOSIS — R071 Chest pain on breathing: Secondary | ICD-10-CM | POA: Diagnosis not present

## 2016-05-05 DIAGNOSIS — R042 Hemoptysis: Secondary | ICD-10-CM | POA: Diagnosis not present

## 2016-05-05 DIAGNOSIS — Z7722 Contact with and (suspected) exposure to environmental tobacco smoke (acute) (chronic): Secondary | ICD-10-CM | POA: Insufficient documentation

## 2016-05-05 LAB — URINALYSIS, ROUTINE W REFLEX MICROSCOPIC
Bilirubin Urine: NEGATIVE
GLUCOSE, UA: NEGATIVE mg/dL
HGB URINE DIPSTICK: NEGATIVE
Ketones, ur: NEGATIVE mg/dL
LEUKOCYTES UA: NEGATIVE
Nitrite: NEGATIVE
PH: 6 (ref 5.0–8.0)
PROTEIN: NEGATIVE mg/dL
Specific Gravity, Urine: 1.02 (ref 1.005–1.030)

## 2016-05-05 LAB — CBC
HEMATOCRIT: 38.7 % (ref 36.0–46.0)
Hemoglobin: 12.8 g/dL (ref 12.0–15.0)
MCH: 28.8 pg (ref 26.0–34.0)
MCHC: 33.1 g/dL (ref 30.0–36.0)
MCV: 87.2 fL (ref 78.0–100.0)
PLATELETS: 243 10*3/uL (ref 150–400)
RBC: 4.44 MIL/uL (ref 3.87–5.11)
RDW: 13.2 % (ref 11.5–15.5)
WBC: 5.3 10*3/uL (ref 4.0–10.5)

## 2016-05-05 NOTE — ED Triage Notes (Signed)
Pt states vomiting and diarrhea. She also c/o headache and generalized weakness. She reports she fainted twice today.

## 2016-05-06 ENCOUNTER — Encounter: Payer: Self-pay | Admitting: Cardiothoracic Surgery

## 2016-05-06 ENCOUNTER — Emergency Department (HOSPITAL_COMMUNITY): Payer: BLUE CROSS/BLUE SHIELD

## 2016-05-06 ENCOUNTER — Emergency Department (HOSPITAL_COMMUNITY)
Admission: EM | Admit: 2016-05-06 | Discharge: 2016-05-06 | Disposition: A | Discharge status: Home / Self Care | Payer: BLUE CROSS/BLUE SHIELD | Attending: Emergency Medicine | Admitting: Emergency Medicine

## 2016-05-06 ENCOUNTER — Encounter (HOSPITAL_COMMUNITY): Payer: Self-pay

## 2016-05-06 ENCOUNTER — Institutional Professional Consult (permissible substitution) (INDEPENDENT_AMBULATORY_CARE_PROVIDER_SITE_OTHER): Payer: BLUE CROSS/BLUE SHIELD | Admitting: Cardiothoracic Surgery

## 2016-05-06 ENCOUNTER — Other Ambulatory Visit: Payer: Self-pay | Admitting: *Deleted

## 2016-05-06 VITALS — BP 116/78 | HR 80 | Resp 20 | Ht 65.0 in | Wt 206.0 lb

## 2016-05-06 DIAGNOSIS — J9859 Other diseases of mediastinum, not elsewhere classified: Secondary | ICD-10-CM

## 2016-05-06 DIAGNOSIS — D4989 Neoplasm of unspecified behavior of other specified sites: Secondary | ICD-10-CM | POA: Diagnosis not present

## 2016-05-06 DIAGNOSIS — R042 Hemoptysis: Secondary | ICD-10-CM

## 2016-05-06 DIAGNOSIS — R071 Chest pain on breathing: Secondary | ICD-10-CM | POA: Diagnosis not present

## 2016-05-06 DIAGNOSIS — R0602 Shortness of breath: Secondary | ICD-10-CM

## 2016-05-06 DIAGNOSIS — R079 Chest pain, unspecified: Secondary | ICD-10-CM

## 2016-05-06 LAB — LIPASE, BLOOD: LIPASE: 39 U/L (ref 11–51)

## 2016-05-06 LAB — COMPREHENSIVE METABOLIC PANEL
ALBUMIN: 3.7 g/dL (ref 3.5–5.0)
ALT: 12 U/L — ABNORMAL LOW (ref 14–54)
AST: 20 U/L (ref 15–41)
Alkaline Phosphatase: 64 U/L (ref 38–126)
Anion gap: 8 (ref 5–15)
BUN: 7 mg/dL (ref 6–20)
CHLORIDE: 107 mmol/L (ref 101–111)
CO2: 21 mmol/L — ABNORMAL LOW (ref 22–32)
CREATININE: 0.74 mg/dL (ref 0.44–1.00)
Calcium: 8.8 mg/dL — ABNORMAL LOW (ref 8.9–10.3)
GFR calc Af Amer: >60 mL/min (ref >60)
GLUCOSE: 148 mg/dL — AB (ref 65–99)
Potassium: 3.8 mmol/L (ref 3.5–5.1)
Sodium: 136 mmol/L (ref 135–145)
Total Bilirubin: 0.4 mg/dL (ref 0.3–1.2)
Total Protein: 7.7 g/dL (ref 6.5–8.1)

## 2016-05-06 LAB — PROTIME-INR
INR: 1.08
PROTHROMBIN TIME: 14.1 s (ref 11.4–15.2)

## 2016-05-06 LAB — I-STAT TROPONIN, ED: Troponin i, poc: 0 ng/mL (ref 0.00–0.08)

## 2016-05-06 LAB — APTT: APTT: 28 s (ref 24–36)

## 2016-05-06 MED ORDER — ONDANSETRON 4 MG PO TBDP
4.0000 mg | ORAL_TABLET | Freq: Once | ORAL | Status: AC
  Administered 2016-05-06: 4 mg via ORAL
  Filled 2016-05-06: qty 1

## 2016-05-06 MED ORDER — PANTOPRAZOLE SODIUM 40 MG IV SOLR
40.0000 mg | Freq: Once | INTRAVENOUS | Status: AC
Start: 2016-05-06 — End: 2016-05-06
  Administered 2016-05-06: 40 mg via INTRAVENOUS
  Filled 2016-05-06: qty 40

## 2016-05-06 MED ORDER — SODIUM CHLORIDE 0.9 % IV BOLUS (SEPSIS)
1000.0000 mL | Freq: Once | INTRAVENOUS | Status: AC
  Administered 2016-05-06: 1000 mL via INTRAVENOUS

## 2016-05-06 MED ORDER — IOPAMIDOL (ISOVUE-370) INJECTION 76%
INTRAVENOUS | Status: AC
  Filled 2016-05-06: qty 100

## 2016-05-06 MED ORDER — IOPAMIDOL (ISOVUE-370) INJECTION 76%
100.0000 mL | Freq: Once | INTRAVENOUS | Status: AC | PRN
  Administered 2016-05-06: 100 mL via INTRAVENOUS

## 2016-05-06 MED ORDER — ACETAMINOPHEN 325 MG PO TABS
650.0000 mg | ORAL_TABLET | Freq: Once | ORAL | Status: AC
  Administered 2016-05-06: 650 mg via ORAL
  Filled 2016-05-06: qty 2

## 2016-05-06 NOTE — ED Notes (Signed)
Pt notified registration that she has a nosebleed. Pt brought back to triage room for evaluation. Pt had napkin with blood on it, no active nosebleed. Pt c/o nausea, states she vomited. Offered zofran for the same.

## 2016-05-06 NOTE — ED Notes (Signed)
Patient transported to CT 

## 2016-05-06 NOTE — ED Provider Notes (Signed)
Received sign out from H. Norris, [place]. Pt here for chest pain x > 1 week with associated blood tinged cough, SOB and vomiting x 6 today, diaphoresis onset last night and nosebleed noted. CT angio chest with nodular soft tissue density in anterior mediastinum. Plan for PT/INR and aPTT; if normal, plan to dc to home with f/u with Dr. Lawson Fiscal, ct surgery.   Ulice Bold, NP 05/06/16 [place], DO 05/06/16 0947

## 2016-05-06 NOTE — ED Notes (Signed)
Bed: WTR8 Expected date:  Expected time:  Means of arrival:  Comments: 

## 2016-05-06 NOTE — ED Provider Notes (Signed)
[facility] DEPT Provider Note   CSN: [id number] Arrival date & time: 05/05/16  2300   By signing my name below, I, Eunice Blase, attest that this documentation has been prepared under the direction and in the presence of [employer], PA-C. Electronically signed, Eunice Blase, ED Scribe. 05/06/16. 1:58 AM.   History   Chief Complaint Chief Complaint  Patient presents with  . Emesis   The history is provided by the patient and medical records. No language interpreter was used.    Tina Hale is a 21 y.o. female with h/o PTSD, migraines, and memory loss, transported via private vehicle to the Emergency Department with concern for constant chest pain x > 1 week. Associated blood tinged cough, SOB and vomiting x 6 today, diaphoresis onset last night and nosebleed noted. She notes 10/10 chest pain worsened with deep breathing. No other modifying factors noted. No leg swelling, abdominal pain or any other complaints noted at this time. Pt on nexplanon and does not remember her LNMP.  She reports night sweats, but no weight loss.  No recent travel. She is not on an anticoagulant.  Denis Hx of DVT, IVDU, smoking.     Past Medical History:  Diagnosis Date  . ADD (attention deficit disorder)   . ADHD (attention deficit hyperactivity disorder)   . Anemia   . Anger   . Anxiety   . Asthma   . Depression   . Head trauma   . Headache   . Memory loss   . PTSD (post-traumatic stress disorder)     Patient Active Problem List   Diagnosis Date Noted  . Nexplanon removal 08/01/2015  . Nexplanon insertion 08/01/2015  . 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 Migraine without aura and without status migrainosus, not intractable 06/01/2014  . Gait disorder 06/01/2014  . Persistent vomiting 05/04/2014  . Left hip pain 05/04/2014  . Closed head injury with brief loss of consciousness ([place]) 05/04/2014  . New daily persistent headache 03/29/2014  .  Neck pain, bilateral 03/29/2014  . Stiff neck 03/29/2014  . Monocular diplopia 03/29/2014  . Left sided numbness 03/29/2014  . Headaches 03/23/2014  . Family circumstance 11/24/2013  . Failed vision screen 11/24/2013  . Obesity 11/24/2013  . Depression 11/24/2013  . Attention deficit hyperactivity disorder 11/24/2013  . Suicidal ideation 11/22/2012  . Homicidal ideation 11/22/2012  . Depression with anxiety 11/22/2012  . Obsessive compulsive disorder 11/22/2012  . 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 Parent-child relational problem 11/22/2012    History reviewed. No pertinent surgical history.  OB History    Gravida Para Term Preterm AB Living   0 0 0 0 0     SAB TAB Ectopic Multiple Live Births   0 0 0           Home Medications    Prior to Admission medications   Medication Sig Start Date End Date Taking? Authorizing Provider  albuterol (PROVENTIL HFA;VENTOLIN HFA) 108 (90 BASE) MCG/ACT inhaler Inhale 2 puffs into the lungs every 6 (six) hours as needed for wheezing or shortness of breath. 11/24/13   Shruti Anderson Malta, MD  azithromycin (ZITHROMAX) 250 MG tablet Take 1 tablet (250 mg total) by mouth daily. Take first 2 tablets together, then 1 every day until finished. Patient not taking: Reported on 07/17/2015 04/13/15   Montine Circle, PA-C  beclomethasone (QVAR) 80 MCG/ACT inhaler Inhale 2 puffs into the lungs 2 (two) times daily. 11/24/13  Shruti Anderson Malta, MD  benzonatate (TESSALON) 100 MG capsule Take 2 capsules (200 mg total) by mouth 2 (two) times daily as needed for cough. Patient not taking: Reported on 07/17/2015 04/13/15   Montine Circle, PA-C  cetirizine (ZYRTEC) 10 MG tablet Take 1 tablet (10 mg total) by mouth daily. 12/23/13   Shruti Anderson Malta, MD  lamoTRIgine (LAMICTAL) 100 MG tablet Take 100 mg by mouth daily. 07/11/15   Historical Provider, MD  loratadine (CLARITIN) 10 MG tablet Take 1 tablet (10 mg total) by mouth  daily. Patient not taking: Reported on 07/17/2015 04/13/15   Montine Circle, PA-C  methylphenidate (RITALIN) 10 MG tablet Take 10 mg by mouth daily at 2 PM. 07/11/15   Historical Provider, MD  methylphenidate 36 MG PO CR tablet Take 2 tablets (72mg ) by mouth every morning. 11/18/14   Historical Provider, MD  oxymetazoline (AFRIN NASAL SPRAY) 0.05 % nasal spray Place 1 spray into both nostrils 2 (two) times daily. 07/17/15   Samantha Tripp Dowless, PA-C  traZODone (DESYREL) 50 MG tablet Take 50-100mg  by mouth at bedtime as needed for sleep 12/16/14   Historical Provider, MD    Family History Family History  Problem Relation Age of Onset  . High blood pressure Mother   . High blood pressure Father   . Cancer Maternal Grandmother     Social History Social History  Substance Use Topics  . Smoking status: Passive Smoke Exposure - Never Smoker  . Smokeless tobacco: Never Used     Comment: Step dad smokes  . Alcohol use No     Allergies   Amoxicillin; Ethyl alcohol (skin cleanser); Other; and Tomato   Review of Systems Review of Systems  Constitutional: Positive for diaphoresis ( night sweats). Negative for chills, fatigue and unexpected weight change.  HENT: Positive for nosebleeds.   Eyes: Negative for visual disturbance.  Respiratory: Positive for cough and shortness of breath.   Cardiovascular: Positive for chest pain. Negative for palpitations and leg swelling.  Gastrointestinal: Positive for nausea and vomiting. Negative for abdominal pain, constipation and diarrhea.  Endocrine: Negative for polydipsia, polyphagia and polyuria.  Genitourinary: Negative for dysuria.  Musculoskeletal: Negative for back pain and joint swelling.  Skin: Negative for rash.  All other systems reviewed and are negative.    Physical Exam Updated Vital Signs BP 123/79   Pulse 68   Temp 98.2 F (36.8 C) (Oral)   Resp 16   SpO2 98%   Physical Exam  Constitutional: She appears well-developed and  well-nourished. No distress.  Awake, alert, nontoxic appearance  HENT:  Head: Normocephalic and atraumatic.  Mouth/Throat: Oropharynx is clear and moist. No oropharyngeal exudate.  Eyes: Conjunctivae are normal. No scleral icterus.  Neck: Normal range of motion. Neck supple.  Cardiovascular: Normal rate, regular rhythm and intact distal pulses.   Pulmonary/Chest: Effort normal and breath sounds normal. No respiratory distress. She has no wheezes.  Equal chest expansion Clear and equal  Abdominal: Soft. Bowel sounds are normal. She exhibits no mass. There is no tenderness. There is no rigidity, no rebound, no guarding, no CVA tenderness, no tenderness at McBurney's point and negative Murphy's sign.  Soft and nontender  Musculoskeletal: Normal range of motion. She exhibits no edema.  No peripheral edema No calf tenderness Neg Homan's sign  Neurological: She is alert.  Speech is clear and goal oriented Moves extremities without ataxia  Skin: Skin is warm and dry. She is not diaphoretic.  Psychiatric: She has a normal mood and  affect.  Nursing note and vitals reviewed.    ED Treatments / Results  DIAGNOSTIC STUDIES: Oxygen Saturation is 98% on RA, NL by my interpretation.    COORDINATION OF CARE: 1:52 AM-Discussed next steps with pt. Pt verbalized understanding and is agreeable with the plan. Will order labs, imaging and medications.   Labs (all labs ordered are listed, but only abnormal results are displayed) Labs Reviewed  COMPREHENSIVE METABOLIC PANEL - Abnormal; Notable for the following:       Result Value   CO2 21 (*)    Glucose, Bld 148 (*)    Calcium 8.8 (*)    ALT 12 (*)    All other components within normal limits  URINALYSIS, ROUTINE W REFLEX MICROSCOPIC - Abnormal; Notable for the following:    APPearance HAZY (*)    All other components within normal limits  LIPASE, BLOOD  CBC  PROTIME-INR  APTT  POC URINE PREG, ED  I-STAT TROPOININ, ED    EKG  EKG  Interpretation  Date/Time:  Monday May 06 2016 02:07:26 EDT Ventricular Rate:  71 PR Interval:    QRS Duration: 84 QT Interval:  394 QTC Calculation: 429 R Axis:   44 Text Interpretation:  Sinus rhythm Borderline ST elevation, lateral leads No old tracing to compare Confirmed by WARD,  DO, KRISTEN ([id number]) on 05/06/2016 2:09:26 AM       Radiology Dg Chest 2 View  Result Date: 05/06/2016 CLINICAL DATA:  Initial evaluation for acute cough, chest pain. EXAM: CHEST  2 VIEW COMPARISON:  Prior radiograph from 07/17/2015. FINDINGS: The cardiac and mediastinal silhouettes are stable in size and contour, and remain within normal limits. The lungs are normally inflated. No airspace consolidation, pleural effusion, or pulmonary edema is identified. There is no pneumothorax. No acute osseous abnormality identified. IMPRESSION: No active cardiopulmonary disease. Electronically Signed   By: Jeannine Boga M.D.   On: 05/06/2016 02:25   Ct Angio Chest Pe W Or Wo Contrast  Result Date: 05/06/2016 CLINICAL DATA:  21 year old female with chest pain for 1 week. EXAM: CT ANGIOGRAPHY CHEST WITH CONTRAST TECHNIQUE: Multidetector CT imaging of the chest was performed using the standard protocol during bolus administration of intravenous contrast. Multiplanar CT image reconstructions and MIPs were obtained to evaluate the vascular anatomy. CONTRAST:  100 cc Isovue 370 COMPARISON:  Chest radiograph dated 05/06/2016 FINDINGS: Cardiovascular: There is mild cardiomegaly. No pericardial effusion. The thoracic aorta appears unremarkable. The origins of the great vessels of the aortic arch appear patent. There is a common origin of the left common carotid artery and right brachiocephalic trunk. Evaluation of the pulmonary arteries is limited due to respiratory motion artifact and suboptimal opacification of the peripheral branches. No CT evidence of pulmonary artery embolus. Mediastinum/Nodes: There is no hilar or  mediastinal adenopathy. An 11 x 23 mm soft tissue density in the anterior mediastinum noted which is not well characterized but may represent a pericardial recess. Other etiologies include a teratoma, a thymic origin lesion. Lymphoma is not excluded. Clinical correlation recommended. MRI may provide better characterisation. Lungs/Pleura: Lungs are clear. No pleural effusion or pneumothorax. Upper Abdomen: Subcentimeter right hepatic hypodense lesion is not well characterized but may represent a cyst or hemangioma. The visualized upper abdomen is otherwise unremarkable. Musculoskeletal: No chest wall abnormality. No acute or significant osseous findings. Review of the MIP images confirms the above findings. IMPRESSION: 1. No acute intrathoracic pathology. No CT evidence of pulmonary embolism. 2. Nodular soft tissue density in the anterior mediastinum  may represent pericardial recess, or an anterior mediastinal mass as mentioned above. MRI may provide better characterisation. Electronically Signed   By: Anner Crete M.D.   On: 05/06/2016 03:38    Procedures Procedures (including critical care time)  Medications Ordered in ED Medications  iopamidol (ISOVUE-370) 76 % injection (not administered)  ondansetron (ZOFRAN-ODT) disintegrating tablet 4 mg (4 mg Oral Given 05/06/16 0122)  sodium chloride 0.9 % bolus 1,000 mL (1,000 mLs Intravenous New Bag/Given 05/06/16 0225)  pantoprazole (PROTONIX) injection 40 mg (40 mg Intravenous Given 05/06/16 0226)  iopamidol (ISOVUE-370) 76 % injection 100 mL (100 mLs Intravenous Contrast Given 05/06/16 0302)     Initial Impression / Assessment and Plan / ED Course  I have reviewed the triage vital signs and the nursing notes.  Pertinent labs & imaging results that were available during my care of the patient were reviewed by me and considered in my medical decision making (see chart for details).  Clinical Course as of May 06 621  Mon May 06, 2016  8756 Discussed  with Dr. Lawson Fiscal who will follow in the office if her coags are normal.    [HM]    Clinical Course User Index [HM] Jarrett Soho Boyce Keltner, PA-C    Orthostatic VS for the past 24 hrs:  BP- Lying Pulse- Lying BP- Sitting Pulse- Sitting BP- Standing at 0 minutes Pulse- Standing at 0 minutes  05/06/16 0241 119/83 77 134/88 73 126/88 81      Pt presents with multiple complaints.  Of concern for me as her chest pain, shortness of breath and complaints of hemoptysis. I have not witnessed any hemoptysis in the emergency room. She also reports night sweats. CT scan shows mediastinal mass but no. Discussed with cardiothoracic surgery who recommends coagulation panel and discharge home if negative. He will follow in the outpatient setting with patient.  Care transferred to Andee Poles, NP who will follow labs and disposition appropriately.  If coags are negative, she may be d/c home to f/u with Dr. Lawson Fiscal.  Final Clinical Impressions(s) / ED Diagnoses   Final diagnoses:  Chest pain, unspecified type  Shortness of breath  Hemoptysis  Mediastinal mass    New Prescriptions New Prescriptions   No medications on file    I personally performed the services described in this documentation, which was scribed in my presence. The recorded information has been reviewed and is accurate.      Jarrett Soho Luby Seamans, PA-C 05/06/16 [place], DO 05/06/16 [phone]

## 2016-05-06 NOTE — Discharge Instructions (Addendum)
1. Medications: acetaminophen for pain-follow over the counter label instructions; continue usual home medications 2. Treatment: rest, drink plenty of fluids,  3. Follow Up: Please followup with your primary doctor and Cardiothoracic surgery in 2-3 days for discussion of your diagnoses and further evaluation after today's visit; if you do not have a primary care doctor use the resource guide provided to find one; Please return to the ER for fevers, chills, unexplained weight loss, dizziness, vision or gait changes, change in/persistent/worsening chest pain, worsening shortness of breath, persistent bleeding that does not stop, passing out, abdominal pain, unable to tolerate food or fluids, worsening symptoms, or any other concerns.

## 2016-05-06 NOTE — ED Provider Notes (Signed)
Pt 14.1, INR 1.08, aPTT 28; pt updated on results. Discussed discharge instructions, return precautions, and follow up. Pt verbalizes understanding using verbal teachback and agrees with plan, denies any additional concerns.    Ulice Bold, NP 05/06/16 [place], DO 05/06/16 [id number]
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
## 2016-05-14 ENCOUNTER — Encounter: Payer: Medicaid Other | Admitting: Cardiothoracic Surgery

## 2016-05-15 ENCOUNTER — Ambulatory Visit (HOSPITAL_COMMUNITY): Payer: BLUE CROSS/BLUE SHIELD

## 2016-05-22 ENCOUNTER — Encounter: Payer: BLUE CROSS/BLUE SHIELD | Admitting: Cardiothoracic Surgery

## 2016-05-23 ENCOUNTER — Encounter: Payer: Self-pay | Admitting: Certified Nurse Midwife

## 2016-05-23 ENCOUNTER — Ambulatory Visit (INDEPENDENT_AMBULATORY_CARE_PROVIDER_SITE_OTHER): Payer: BLUE CROSS/BLUE SHIELD | Admitting: Certified Nurse Midwife

## 2016-05-23 VITALS — BP 116/70 | HR 93 | Wt 209.1 lb

## 2016-05-23 DIAGNOSIS — Z3046 Encounter for surveillance of implantable subdermal contraceptive: Secondary | ICD-10-CM

## 2016-05-23 DIAGNOSIS — Z308 Encounter for other contraceptive management: Secondary | ICD-10-CM | POA: Diagnosis not present

## 2016-05-23 DIAGNOSIS — Z30017 Encounter for initial prescription of implantable subdermal contraceptive: Secondary | ICD-10-CM

## 2016-05-23 MED ORDER — ETONOGESTREL 68 MG ~~LOC~~ IMPL
68.0000 mg | DRUG_IMPLANT | Freq: Once | SUBCUTANEOUS | Status: AC
  Administered 2016-05-23: 68 mg via SUBCUTANEOUS

## 2016-05-23 NOTE — Progress Notes (Signed)
Patient is in the office and states that she believes that her nexplanon is broken in her arm and wants to have it checked. Pt states that she believes that it broke about 9 months ago, and states that she is currently not sexually active.

## 2016-05-24 NOTE — Progress Notes (Signed)
Nexplanon Removal and Reinsertion Procedure Note   PRE-OP DIAGNOSIS: desired long-term, reversible contraception, broken Nexplanon removed prior to reinsertion.    POST-OP DIAGNOSIS: Contraception, AUB management PROCEDURE: Nexplanon  Removal and replacement Performing Provider: Kandis Cocking CNM    Patient had Nexplanon inserted in 07/31/16. Desires removal today.   Reviewed risk and benefits of procedure. Alternative options discussed Patient reported understanding and agreed to continue.   The patient's left arm was palpated and the implant device located. The area was prepped with Betadinex3. The distal end of the device was palpated and 1 cc of 1% lidocaine without epinephrine was injected. A 2  mm incision was made distal and midline d/t nexplanon fracture roughly half way up the device and did not know if it was completely fractured. . Any fibrotic tissue was carefully dissected away using blunt and/or sharp dissection. Over the tip and the tip was exposed, grasped with forcep and removed intact, with break .   Patient education prior to procedure, explained risk, benefits of Nexplanon, reviewed alternative options. Patient reported understanding. Gave consent to continue with procedure.   PROCEDURE:  Pregnancy Text :  Negative Site (check):      left arm          Lot # [id number] Expiration Date 10/2018  Insertion site was selected 8 - 10 cm from medial epicondyle and marked along with guiding site using sterile marker. Procedure area was prepped and draped in a sterile fashion. 1% Lidocaine 1.5 ml given prior to procedure. Nexplanon  was inserted subcutaneously.Needle was removed from the insertion site. Nexplanon capsule was palpated by provider and patient to assure satisfactory placement. 4/0 vicryl used on incision sites.  And a bandage applied and the arm was wrapped with gauze bandage.     Followup: The patient tolerated the procedure well without complications.   Instructions:  The patient was instructed to remove the dressing in 24 hours and that some bruising is to be expected.  She was advised to use over the counter analgesics as needed for any pain at the site.  She is to keep the area dry for 24 hours and to call if her hand or arm becomes cold, numb, or blue.   Kandis Cocking CNM   As a side note patient was noted to have multiple statements that were not congruent with logical thinking about wanting to see eyeballs rolling into the back of dead peoples heads, enjoying pain, no direct suicidal/homicidal statements made, just very inappropriate thoughts expressed.   At the end of the procedure stated that she felt the whole thing but not once told me that she could feel anything or expressed/screamed, flinched, etc.  Did not have a good explanation as to why she did not say something about being in pain during the procedure.

## 2016-05-26 ENCOUNTER — Encounter: Payer: Self-pay | Admitting: Certified Nurse Midwife

## 2016-05-30 ENCOUNTER — Ambulatory Visit (HOSPITAL_COMMUNITY)
Admission: RE | Admit: 2016-05-30 | Discharge: 2016-05-30 | Disposition: A | Discharge status: Home / Self Care | Payer: BLUE CROSS/BLUE SHIELD | Source: Ambulatory Visit | Attending: Cardiothoracic Surgery | Admitting: Cardiothoracic Surgery

## 2016-05-30 DIAGNOSIS — J9859 Other diseases of mediastinum, not elsewhere classified: Secondary | ICD-10-CM | POA: Insufficient documentation

## 2016-05-30 LAB — GLUCOSE, CAPILLARY: Glucose-Capillary: 62 mg/dL — ABNORMAL LOW (ref 65–99)

## 2016-05-30 MED ORDER — FLUDEOXYGLUCOSE F - 18 (FDG) INJECTION
10.3400 | Freq: Once | INTRAVENOUS | Status: AC | PRN
  Administered 2016-05-30: 10.34 via INTRAVENOUS

## 2016-05-30 MED ORDER — FLUDEOXYGLUCOSE F - 18 (FDG) INJECTION: 10.3400 | Freq: Once | INTRAVENOUS | Status: DC | PRN

## 2016-06-05 ENCOUNTER — Encounter: Payer: BLUE CROSS/BLUE SHIELD | Admitting: Cardiothoracic Surgery

## 2016-06-07 ENCOUNTER — Ambulatory Visit (INDEPENDENT_AMBULATORY_CARE_PROVIDER_SITE_OTHER): Payer: BLUE CROSS/BLUE SHIELD | Admitting: Cardiothoracic Surgery

## 2016-06-07 ENCOUNTER — Encounter: Payer: Self-pay | Admitting: Cardiothoracic Surgery

## 2016-06-07 VITALS — BP 122/80 | HR 86 | Resp 20 | Ht 65.0 in | Wt 209.0 lb

## 2016-06-07 DIAGNOSIS — R45851 Suicidal ideations: Secondary | ICD-10-CM

## 2016-06-07 NOTE — Progress Notes (Signed)
PCP is Nolene Ebbs, MD Referring Provider is Nolene Ebbs, MD  Chief Complaint  Patient presents with  . 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 To obtain confirmation that this was not a lymphoma or teratoma the patient underwent PET scan which showed only minimal activity consistent with normal residual thymic tissue. The patient remains asymptomatic.  The results of the skin were discussed in detail with the patient and her mother. They understand that her thymic tissue should involute with time as she ages. They understand that this is not a abnormal condition and does not need medication for suppressive therapy nor does it need surgery.   Past Medical History:  Diagnosis Date  . ADD (attention deficit disorder)   . ADHD (attention deficit hyperactivity disorder)   . Anemia   . Anger   . Anxiety   . Asthma   . Depression   . Head trauma   . Headache   . Memory loss   . PTSD (post-traumatic stress disorder)     No past surgical history on file.  Family History  Problem Relation Age of Onset  . High blood pressure Mother   . High blood pressure Father   . Cancer Maternal Grandmother     Social History Social History  Substance Use Topics  . Smoking status: Passive Smoke Exposure - Never Smoker  . Smokeless tobacco: Never Used     Comment: Step dad smokes  . Alcohol use No    Current Outpatient Prescriptions  Medication Sig Dispense Refill  . albuterol (PROVENTIL HFA;VENTOLIN HFA) 108 (90 BASE) MCG/ACT inhaler Inhale 2 puffs into the lungs every 6 (six) hours as needed for wheezing or shortness of breath. 1 Inhaler 1  . beclomethasone (QVAR) 80 MCG/ACT inhaler Inhale 2 puffs into the lungs 2 (two) times daily. 1 Inhaler 3  . cetirizine (ZYRTEC) 10 MG  tablet Take 1 tablet (10 mg total) by mouth daily. 30 tablet 3  . lamoTRIgine (LAMICTAL) 100 MG tablet Take 100 mg by mouth daily.  1  . loratadine (CLARITIN) 10 MG tablet Take 1 tablet (10 mg total) by mouth daily. 30 tablet 0  . methylphenidate (RITALIN) 10 MG tablet Take 10 mg by mouth daily at 2 PM.  0  . methylphenidate 36 MG PO CR tablet Take 2 tablets (72mg ) by mouth every morning.  0  . traZODone (DESYREL) 50 MG tablet Take 50-100mg  by mouth at bedtime as needed for sleep  0   No current facility-administered medications for this visit.     Allergies  Allergen Reactions  . Amoxicillin Other (See Comments)  . Ethyl Alcohol (Skin Cleanser) Other (See Comments)    burns  . Other Other (See Comments)    Rubbing Alcohol : Makes skin red.  . Tomato Other (See Comments)    Acid reflux    Review of Systems  Mild headaches no syncope No difficulty swallowing No shortness of breath or productive cough No abdominal pain or change in bowel habits No dysuria No calf Tenderness No ankle edema  BP 122/80   Pulse 86   Resp 20   Ht 5\' 5"  (1.651 m)   Wt 209 lb (94.8 kg)   SpO2 99%   BMI 34.78 kg/m  Physical Exam  Exam    General- alert and comfortable, obese 21 year old female   Lungs- clear without rales, wheezes   Cor- regular rate and rhythm, no murmur , gallop   Abdomen- soft, non-tender   Extremities - warm, non-tender, minimal edema   Neuro- oriented, appropriate, no focal weakness   Diagnostic Tests: PET scan images personally reviewed 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 Patient to return the office with CT scan in one year.  Len Childs, MD [facility] [phone]

## 2016-06-11 ENCOUNTER — Ambulatory Visit (INDEPENDENT_AMBULATORY_CARE_PROVIDER_SITE_OTHER): Payer: BLUE CROSS/BLUE SHIELD | Admitting: Certified Nurse Midwife

## 2016-06-11 ENCOUNTER — Encounter: Payer: Self-pay | Admitting: Certified Nurse Midwife

## 2016-06-11 VITALS — BP 108/73 | HR 84 | Wt 214.0 lb

## 2016-06-11 DIAGNOSIS — Z3046 Encounter for surveillance of implantable subdermal contraceptive: Secondary | ICD-10-CM | POA: Diagnosis not present

## 2016-06-11 DIAGNOSIS — Z4802 Encounter for removal of sutures: Secondary | ICD-10-CM | POA: Diagnosis not present

## 2016-06-11 NOTE — Progress Notes (Signed)
Patient ID: Tina Hale, female   DOB: [birth year], 21 y.o.   MRN: [id number]  Chief Complaint  Patient presents with  . Follow-up    nexplanon suture removal    HPI Tina Hale is a 21 y.o. female.    HPI S: patient comes in for f/u after Nexplanon removal and placement on 05/23/16.  4.0 vicryl was used, sutures did not dissolve.  Denies any s/s infection, loss of sensation or decreased movement of extremity.    Past Medical History:  Diagnosis Date  . ADD (attention deficit disorder)   . ADHD (attention deficit hyperactivity disorder)   . Anemia   . Anger   . Anxiety   . Asthma   . Depression   . Head trauma   . Headache   . Memory loss   . PTSD (post-traumatic stress disorder)     No past surgical history on file.  Family History  Problem Relation Age of Onset  . High blood pressure Mother   . High blood pressure Father   . Cancer Maternal Grandmother     Social History Social History  Substance Use Topics  . Smoking status: Passive Smoke Exposure - Never Smoker  . Smokeless tobacco: Never Used     Comment: Step dad smokes  . Alcohol use No    Allergies  Allergen Reactions  . Amoxicillin Other (See Comments)  . Ethyl Alcohol (Skin Cleanser) Other (See Comments)    burns  . Other Other (See Comments)    Rubbing Alcohol : Makes skin red.  . Tomato Other (See Comments)    Acid reflux    Current Outpatient Prescriptions  Medication Sig Dispense Refill  . albuterol (PROVENTIL HFA;VENTOLIN HFA) 108 (90 BASE) MCG/ACT inhaler Inhale 2 puffs into the lungs every 6 (six) hours as needed for wheezing or shortness of breath. 1 Inhaler 1  . beclomethasone (QVAR) 80 MCG/ACT inhaler Inhale 2 puffs into the lungs 2 (two) times daily. 1 Inhaler 3  . cetirizine (ZYRTEC) 10 MG tablet Take 1 tablet (10 mg total) by mouth daily. 30 tablet 3  . lamoTRIgine (LAMICTAL) 100 MG tablet Take 100 mg by mouth daily.  1  . loratadine (CLARITIN) 10 MG tablet Take 1 tablet (10 mg total)  by mouth daily. 30 tablet 0  . methylphenidate (RITALIN) 10 MG tablet Take 10 mg by mouth daily at 2 PM.  0  . methylphenidate 36 MG PO CR tablet Take 2 tablets (72mg ) by mouth every morning.  0  . traZODone (DESYREL) 50 MG tablet Take 50-100mg  by mouth at bedtime as needed for sleep  0   No current facility-administered medications for this visit.     Review of Systems Review of Systems Constitutional: negative for fatigue and weight loss Respiratory: negative for cough and wheezing Cardiovascular: negative for chest pain, fatigue and palpitations Gastrointestinal: negative for abdominal pain and change in bowel habits Genitourinary:negative Integument/breast: negative for nipple discharge Musculoskeletal:negative for myalgias Neurological: negative for gait problems and tremors Behavioral/Psych: negative for abusive relationship, depression Endocrine: negative for temperature intolerance      Blood pressure 108/73, pulse 84, weight 214 lb (97.1 kg).   Physical Exam  O: sutures C/D/I on upper left bicep.  The two 4-0 nylon vicryl sutures were removed without difficulty.  Band-Aid and antibiotic ointment applied.    A: Laceration healed.  Nexplanon intact.    P: return for annual exam.

## 2016-07-11 ENCOUNTER — Other Ambulatory Visit: Payer: Self-pay | Admitting: Certified Nurse Midwife

## 2016-10-06 ENCOUNTER — Encounter (HOSPITAL_COMMUNITY): Payer: Self-pay

## 2016-10-06 ENCOUNTER — Emergency Department (HOSPITAL_COMMUNITY)
Admission: EM | Admit: 2016-10-06 | Discharge: 2016-10-07 | Disposition: A | Discharge status: Home / Self Care | Payer: BLUE CROSS/BLUE SHIELD | Attending: Emergency Medicine | Admitting: Emergency Medicine

## 2016-10-06 DIAGNOSIS — X58XXXA Exposure to other specified factors, initial encounter: Secondary | ICD-10-CM | POA: Diagnosis not present

## 2016-10-06 DIAGNOSIS — T148XXA Other injury of unspecified body region, initial encounter: Secondary | ICD-10-CM

## 2016-10-06 DIAGNOSIS — Z7722 Contact with and (suspected) exposure to environmental tobacco smoke (acute) (chronic): Secondary | ICD-10-CM | POA: Diagnosis not present

## 2016-10-06 DIAGNOSIS — Y939 Activity, unspecified: Secondary | ICD-10-CM | POA: Insufficient documentation

## 2016-10-06 DIAGNOSIS — J45909 Unspecified asthma, uncomplicated: Secondary | ICD-10-CM | POA: Insufficient documentation

## 2016-10-06 DIAGNOSIS — S5011XA Contusion of right forearm, initial encounter: Secondary | ICD-10-CM | POA: Insufficient documentation

## 2016-10-06 DIAGNOSIS — Y999 Unspecified external cause status: Secondary | ICD-10-CM | POA: Insufficient documentation

## 2016-10-06 DIAGNOSIS — Z79899 Other long term (current) drug therapy: Secondary | ICD-10-CM | POA: Diagnosis not present

## 2016-10-06 DIAGNOSIS — M79601 Pain in right arm: Secondary | ICD-10-CM | POA: Diagnosis not present

## 2016-10-06 DIAGNOSIS — R079 Chest pain, unspecified: Secondary | ICD-10-CM | POA: Insufficient documentation

## 2016-10-06 DIAGNOSIS — Y929 Unspecified place or not applicable: Secondary | ICD-10-CM | POA: Insufficient documentation

## 2016-10-06 NOTE — ED Triage Notes (Signed)
Onset 2 days ago pt noticed bruising to right anterior forearm.  Pt c/o "burning" "feels like vein tightening up".  No know injury.

## 2016-10-06 NOTE — ED Provider Notes (Signed)
[place] DEPT Provider Note   CSN: [id number] Arrival date & time: 10/06/16  1929     History   Chief Complaint Chief Complaint  Patient presents with  . Bleeding/Bruising    HPI Tina Hale is a 21 y.o. female with history of asthma, ADHD, anxiety, depression who presents with a 2 day history of right arm pain and bruising. Patient reports that it feels like her veins aren't working well. She reports pain in her arm and areas of bruising when she moves her fingers. She denies any history of injury or trauma to the arm. She reports over the past hour she has had intermittent sharp chest pains centrally. When they are present, they are pleuritic. She denies pleuritic symptoms when her chest is not hurting. She has gotten in the past, but she stated it has happened in the past hour. She denies any shortness of breath. She reports 2 days ago she also had a nosebleed that was bleeding a lot more than normal. She denies any abdominal pain, nausea, vomiting, urinary symptoms.  HPI  Past Medical History:  Diagnosis Date  . ADD (attention deficit disorder)   . ADHD (attention deficit hyperactivity disorder)   . Anemia   . Anger   . Anxiety   . Asthma   . Depression   . Head trauma   . Headache   . Memory loss   . PTSD (post-traumatic stress disorder)     Patient Active Problem List   Diagnosis Date Noted  . Nexplanon removal 08/01/2015  . Nexplanon insertion 08/01/2015  . 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 Migraine without aura and without status migrainosus, not intractable 06/01/2014  . Gait disorder 06/01/2014  . Persistent vomiting 05/04/2014  . Left hip pain 05/04/2014  . Closed head injury with brief loss of consciousness ([place]) 05/04/2014  . New daily persistent headache 03/29/2014  . Neck pain, bilateral 03/29/2014  . Stiff neck 03/29/2014  . Monocular diplopia 03/29/2014  . Left sided numbness 03/29/2014  . Headaches  03/23/2014  . Family circumstance 11/24/2013  . Failed vision screen 11/24/2013  . Obesity 11/24/2013  . Depression 11/24/2013  . Attention deficit hyperactivity disorder 11/24/2013  . Suicidal ideation 11/22/2012  . Homicidal ideation 11/22/2012  . Depression with anxiety 11/22/2012  . Obsessive compulsive disorder 11/22/2012  . 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 Parent-child relational problem 11/22/2012    History reviewed. No pertinent surgical history.  OB History    Gravida Para Term Preterm AB Living   0 0 0 0 0     SAB TAB Ectopic Multiple Live Births   0 0 0           Home Medications    Prior to Admission medications   Medication Sig Start Date End Date Taking? Authorizing Provider  albuterol (PROVENTIL HFA;VENTOLIN HFA) 108 (90 BASE) MCG/ACT inhaler Inhale 2 puffs into the lungs every 6 (six) hours as needed for wheezing or shortness of breath. 11/24/13  Yes Simha, Shruti V, MD  buPROPion (WELLBUTRIN XL) 150 MG 24 hr tablet Take 150 mg by mouth daily.   Yes [provider]  cetirizine (ZYRTEC) 10 MG tablet Take 1 tablet (10 mg total) by mouth daily. 12/23/13  Yes Simha, Shruti V, MD  cloNIDine (CATAPRES) 0.1 MG tablet Take 0.1 mg by mouth daily.   Yes [provider]  Fluticasone-Salmeterol (ADVAIR) 250-50 MCG/DOSE AEPB Inhale 1 puff into the lungs 2 (two)  times daily.   Yes [provider]  lamoTRIgine (LAMICTAL) 100 MG tablet Take 100 mg by mouth daily. 07/11/15  Yes [provider]  methylphenidate 36 MG PO CR tablet Take 2 tablets (72mg ) by mouth every morning. 11/18/14  Yes [provider]  traZODone (DESYREL) 100 MG tablet Take 100 mg by mouth at bedtime.   Yes [provider]  beclomethasone (QVAR) 80 MCG/ACT inhaler Inhale 2 puffs into the lungs 2 (two) times daily. Patient not taking: Reported on 10/07/2016 11/24/13   Ok Edwards, MD  loratadine (CLARITIN) 10  MG tablet Take 1 tablet (10 mg total) by mouth daily. Patient not taking: Reported on 10/07/2016 04/13/15   Montine Circle, PA-C  traMADol (ULTRAM) 50 MG tablet Take 1 tablet (50 mg total) by mouth every 6 (six) hours as needed. 4/62/70   Delora Fuel, MD    Family History Family History  Problem Relation Age of Onset  . High blood pressure Mother   . High blood pressure Father   . Cancer Maternal Grandmother     Social History Social History  Substance Use Topics  . Smoking status: Passive Smoke Exposure - Never Smoker  . Smokeless tobacco: Never Used     Comment: Step dad smokes  . Alcohol use No     Allergies   Amoxicillin; Ethyl alcohol (skin cleanser); Other; and Tomato   Review of Systems Review of Systems  Constitutional: Negative for chills and fever.  HENT: Negative for facial swelling and sore throat.   Respiratory: Negative for shortness of breath.   Cardiovascular: Positive for chest pain.  Gastrointestinal: Negative for abdominal pain, nausea and vomiting.  Genitourinary: Negative for dysuria.  Musculoskeletal: Positive for myalgias. Negative for back pain.  Skin: Negative for rash and wound.  Neurological: Negative for headaches.  Hematological: Bruises/bleeds easily.  Psychiatric/Behavioral: The patient is not nervous/anxious.      Physical Exam Updated Vital Signs BP 92/61   Pulse 88   Temp 99.2 F (37.3 C) (Oral)   Resp 18   Ht 5\' 6"  (1.676 m)   Wt 97.5 kg (215 lb)   SpO2 100%   BMI 34.70 kg/m   Physical Exam  Constitutional: She appears well-developed and well-nourished. No distress.  HENT:  Head: Normocephalic and atraumatic.  Mouth/Throat: Oropharynx is clear and moist. No oropharyngeal exudate.  Eyes: Pupils are equal, round, and reactive to light. Conjunctivae are normal. Right eye exhibits no discharge. Left eye exhibits no discharge. No scleral icterus.  Neck: Normal range of motion. Neck supple. No thyromegaly present.    Cardiovascular: Normal rate, regular rhythm, normal heart sounds and intact distal pulses.  Exam reveals no gallop and no friction rub.   No murmur heard. Pulmonary/Chest: Effort normal and breath sounds normal. No stridor. No respiratory distress. She has no wheezes. She has no rales. She exhibits tenderness.    Abdominal: Soft. Bowel sounds are normal. She exhibits no distension. There is no tenderness. There is no rebound and no guarding.  Musculoskeletal: She exhibits no edema.       Arms: Numbness to light and sharp touch to area below ecchymosis on anterior forearm  Lymphadenopathy:    She has no cervical adenopathy.  Neurological: She is alert. Coordination normal.  Skin: Skin is warm and dry. No rash noted. She is not diaphoretic. No pallor.  Psychiatric: She has a normal mood and affect.  Nursing note and vitals reviewed.    ED Treatments / Results  Labs (all  labs ordered are listed, but only abnormal results are displayed) Labs Reviewed  BASIC METABOLIC PANEL - Abnormal; Notable for the following:       Result Value   Potassium 3.4 (*)    All other components within normal limits  CBC WITH DIFFERENTIAL/PLATELET  PROTIME-INR  APTT  D-DIMER, QUANTITATIVE (NOT AT [facility])  I-STAT TROPONIN, ED  POC URINE PREG, ED    EKG  EKG Interpretation  Date/Time:  Monday October 07 2016 00:21:19 EDT Ventricular Rate:  87 PR Interval:    QRS Duration: 70 QT Interval:  386 QTC Calculation: 465 R Axis:   49 Text Interpretation:  Sinus rhythm Borderline T wave abnormalities When compared with ECG of 05/06/2016, Borderline T wave abnormality is now present Confirmed by Delora Fuel ([id number]) on 10/07/2016 12:24:27 AM       Radiology Dg Chest 2 View  Result Date: 10/07/2016 CLINICAL DATA:  Chest pain EXAM: CHEST  2 VIEW COMPARISON:  CT chest 05/06/2016 Chest radiograph 05/06/2016 FINDINGS: The heart size and mediastinal contours are within normal limits. Both lungs are clear. The  visualized skeletal structures are unremarkable. IMPRESSION: No active cardiopulmonary disease. Electronically Signed   By: Ulyses Jarred M.D.   On: 10/07/2016 01:04    Procedures Procedures (including critical care time)  Medications Ordered in ED Medications  Rivaroxaban (XARELTO) tablet 15 mg (15 mg Oral Given 10/07/16 0225)     Initial Impression / Assessment and Plan / ED Course  I have reviewed the triage vital signs and the nursing notes.  Pertinent labs & imaging results that were available during my care of the patient were reviewed by me and considered in my medical decision making (see chart for details).     Concern for DVT and right upper extremity. Labs are unremarkable, including CBC, BMP, PT INR, APTT, d-dimer, troponin. Chest x-ray is negative.EKG shows NSR, borderline T wave abnormalities. Considering concerning presentation and unavailability of Doppler ultrasound at this time, will have patient return for ultrasound later today. Patient given single dose of Xarelto 15mg . patient advised to follow-up with primary care provider for further evaluation and treatment following ultrasound. Return precautions discussed. Patient understands and agrees with plan. Patient vitals stable throughout ED course and discharged in satisfactory condition. Patient also evaluated by Dr. Roxanne Mins who guided the patient's management and agrees with plan.   Final Clinical Impressions(s) / ED Diagnoses   Final diagnoses:  Bruising  Right arm pain    New Prescriptions Discharge Medication List as of 10/07/2016  2:23 AM       Frederica Kuster, PA-C 60/45/40 [id number]    Delora Fuel, MD 91/47/82 [id number]    Delora Fuel, MD 13/08/65 [phone]

## 2016-10-06 NOTE — ED Notes (Signed)
The pt has had a bruise for several days to her rt forearm  She reports that her  Finger movement causes pain in her bruises

## 2016-10-07 ENCOUNTER — Ambulatory Visit (HOSPITAL_BASED_OUTPATIENT_CLINIC_OR_DEPARTMENT_OTHER)
Admission: RE | Admit: 2016-10-07 | Discharge: 2016-10-07 | Disposition: A | Discharge status: Home / Self Care | Payer: BLUE CROSS/BLUE SHIELD | Source: Ambulatory Visit | Attending: Emergency Medicine | Admitting: Emergency Medicine

## 2016-10-07 ENCOUNTER — Emergency Department (HOSPITAL_COMMUNITY): Payer: BLUE CROSS/BLUE SHIELD

## 2016-10-07 DIAGNOSIS — M79609 Pain in unspecified limb: Secondary | ICD-10-CM

## 2016-10-07 DIAGNOSIS — M7989 Other specified soft tissue disorders: Secondary | ICD-10-CM | POA: Diagnosis not present

## 2016-10-07 LAB — D-DIMER, QUANTITATIVE: D-Dimer, Quant: 0.28 ug/mL-FEU (ref 0.00–0.50)

## 2016-10-07 LAB — BASIC METABOLIC PANEL
Anion gap: 6 (ref 5–15)
BUN: 7 mg/dL (ref 6–20)
CO2: 26 mmol/L (ref 22–32)
Calcium: 9 mg/dL (ref 8.9–10.3)
Chloride: 105 mmol/L (ref 101–111)
Creatinine, Ser: 0.81 mg/dL (ref 0.44–1.00)
GFR calc Af Amer: >60 mL/min (ref >60)
GFR calc non Af Amer: >60 mL/min (ref >60)
Glucose, Bld: 89 mg/dL (ref 65–99)
Potassium: 3.4 mmol/L — ABNORMAL LOW (ref 3.5–5.1)
Sodium: 137 mmol/L (ref 135–145)

## 2016-10-07 LAB — CBC WITH DIFFERENTIAL/PLATELET
Basophils Absolute: 0 10*3/uL (ref 0.0–0.1)
Basophils Relative: 0 %
Eosinophils Absolute: 0 10*3/uL (ref 0.0–0.7)
Eosinophils Relative: 0 %
HCT: 38.1 % (ref 36.0–46.0)
Hemoglobin: 12.4 g/dL (ref 12.0–15.0)
Lymphocytes Relative: 42 %
Lymphs Abs: 2.2 10*3/uL (ref 0.7–4.0)
MCH: 29.3 pg (ref 26.0–34.0)
MCHC: 32.5 g/dL (ref 30.0–36.0)
MCV: 90.1 fL (ref 78.0–100.0)
Monocytes Absolute: 0.3 10*3/uL (ref 0.1–1.0)
Monocytes Relative: 7 %
Neutro Abs: 2.6 10*3/uL (ref 1.7–7.7)
Neutrophils Relative %: 51 %
Platelets: 255 10*3/uL (ref 150–400)
RBC: 4.23 MIL/uL (ref 3.87–5.11)
RDW: 13.8 % (ref 11.5–15.5)
WBC: 5.2 10*3/uL (ref 4.0–10.5)

## 2016-10-07 LAB — I-STAT TROPONIN, ED: Troponin i, poc: 0 ng/mL (ref 0.00–0.08)

## 2016-10-07 LAB — PROTIME-INR
INR: 1.19
Prothrombin Time: 15 seconds (ref 11.4–15.2)

## 2016-10-07 LAB — APTT: aPTT: 30 seconds (ref 24–36)

## 2016-10-07 MED ORDER — RIVAROXABAN 15 MG PO TABS
15.0000 mg | ORAL_TABLET | Freq: Once | ORAL | Status: AC
  Administered 2016-10-07: 15 mg via ORAL
  Filled 2016-10-07: qty 1

## 2016-10-07 MED ORDER — TRAMADOL HCL 50 MG PO TABS: 50.0000 mg | ORAL_TABLET | Freq: Four times a day (QID) | ORAL | 0 refills | Status: DC | PRN

## 2016-10-07 MED ORDER — ENOXAPARIN SODIUM 100 MG/ML ~~LOC~~ SOLN
1.0000 mg/kg | Freq: Once | SUBCUTANEOUS | Status: DC
  Filled 2016-10-07: qty 1

## 2016-10-07 NOTE — Discharge Instructions (Signed)
Please return in the morning for an ultrasound of your arm to rule out a blood clot. If it is positive, you will be started on a blood thinner. It is negative, please follow up with your primary care provider for further evaluation and treatment. You can take Tylenol as prescribed over-the-counter, as needed for your pain. Do not take NSAID medications like ibuprofen or Aleve at this time. You can wear your sling for comfort. Please return to the emergency department if you develop any new or worsening symptoms.

## 2016-10-07 NOTE — ED Provider Notes (Deleted)
21 year old female comes in because of bruising and swelling of her right arm. On exam, there are 3 ecchymotic lesions and the volar aspect of the right forearm. These are slightly raised. There is generalized swelling of the right arm with some tenderness extending up into the axilla. This is certainly concerning for axillary vein thrombosis. There is some decreased sensation over the volar aspect of the right forearm which is not following any clear neurologic pattern. She will be treated with rivaroxaban and brought back for venous Doppler studies.  Medical screening examination/treatment/procedure(s) were conducted as a shared visit with non-physician practitioner(s) and myself.  I personally evaluated the patient during the encounter.   EKG Interpretation  Date/Time:  Monday October 07 2016 00:21:19 EDT Ventricular Rate:  87 PR Interval:    QRS Duration: 70 QT Interval:  386 QTC Calculation: 465 R Axis:   49 Text Interpretation:  Sinus rhythm Borderline T wave abnormalities When compared with ECG of 05/06/2016, Borderline T wave abnormality is now present Confirmed by Delora Fuel ([id number]) on 10/07/2016 00:71:21 AM        Delora Fuel, MD 97/58/83 [phone]

## 2016-10-07 NOTE — Progress Notes (Signed)
VASCULAR LAB PRELIMINARY  PRELIMINARY  PRELIMINARY  PRELIMINARY  Right upper extremity venous duplex completed.    Preliminary report:  Right :  No evidence of DVT or superficial thrombosis.    Jamin Panther, RVS 10/07/2016, 11:48 AM

## 2016-10-10 ENCOUNTER — Emergency Department (HOSPITAL_COMMUNITY): Payer: BLUE CROSS/BLUE SHIELD

## 2016-10-10 ENCOUNTER — Other Ambulatory Visit: Payer: Self-pay

## 2016-10-10 DIAGNOSIS — J45909 Unspecified asthma, uncomplicated: Secondary | ICD-10-CM | POA: Insufficient documentation

## 2016-10-10 DIAGNOSIS — R1011 Right upper quadrant pain: Secondary | ICD-10-CM | POA: Diagnosis present

## 2016-10-10 DIAGNOSIS — Z79899 Other long term (current) drug therapy: Secondary | ICD-10-CM | POA: Insufficient documentation

## 2016-10-10 DIAGNOSIS — Z7722 Contact with and (suspected) exposure to environmental tobacco smoke (acute) (chronic): Secondary | ICD-10-CM | POA: Insufficient documentation

## 2016-10-10 DIAGNOSIS — F909 Attention-deficit hyperactivity disorder, unspecified type: Secondary | ICD-10-CM | POA: Insufficient documentation

## 2016-10-10 MED ORDER — ONDANSETRON 4 MG PO TBDP
4.0000 mg | ORAL_TABLET | Freq: Once | ORAL | Status: AC | PRN
  Administered 2016-10-10: 4 mg via ORAL
  Filled 2016-10-10: qty 1

## 2016-10-10 NOTE — ED Triage Notes (Signed)
Pt from home with c/o central chest burning x 2 days that radiates to her back. Pt denies nausea at present time, but reports emesis earlier. Pt reports LBM was 2 weeks ago. Pt has been evaluated for the same and was told it was gas related pain.

## 2016-10-11 ENCOUNTER — Emergency Department (HOSPITAL_COMMUNITY): Payer: BLUE CROSS/BLUE SHIELD

## 2016-10-11 ENCOUNTER — Emergency Department (HOSPITAL_COMMUNITY)
Admission: EM | Admit: 2016-10-11 | Discharge: 2016-10-11 | Disposition: A | Discharge status: Home / Self Care | Payer: BLUE CROSS/BLUE SHIELD | Attending: Emergency Medicine | Admitting: Emergency Medicine

## 2016-10-11 DIAGNOSIS — R109 Unspecified abdominal pain: Secondary | ICD-10-CM

## 2016-10-11 LAB — CBC
HEMATOCRIT: 38.6 % (ref 36.0–46.0)
HEMOGLOBIN: 13.1 g/dL (ref 12.0–15.0)
MCH: 29.4 pg (ref 26.0–34.0)
MCHC: 33.9 g/dL (ref 30.0–36.0)
MCV: 86.7 fL (ref 78.0–100.0)
Platelets: 269 10*3/uL (ref 150–400)
RBC: 4.45 MIL/uL (ref 3.87–5.11)
RDW: 13.3 % (ref 11.5–15.5)
WBC: 4.7 10*3/uL (ref 4.0–10.5)

## 2016-10-11 LAB — BASIC METABOLIC PANEL
ANION GAP: 6 (ref 5–15)
BUN: 6 mg/dL (ref 6–20)
CHLORIDE: 105 mmol/L (ref 101–111)
CO2: 24 mmol/L (ref 22–32)
Calcium: 9.1 mg/dL (ref 8.9–10.3)
Creatinine, Ser: 0.57 mg/dL (ref 0.44–1.00)
GFR calc Af Amer: >60 mL/min (ref >60)
GLUCOSE: 94 mg/dL (ref 65–99)
POTASSIUM: 3.9 mmol/L (ref 3.5–5.1)
Sodium: 135 mmol/L (ref 135–145)

## 2016-10-11 LAB — HEPATIC FUNCTION PANEL
ALBUMIN: 3.5 g/dL (ref 3.5–5.0)
ALT: 17 U/L (ref 14–54)
AST: 19 U/L (ref 15–41)
Alkaline Phosphatase: 66 U/L (ref 38–126)
Bilirubin, Direct: <0.1 mg/dL — ABNORMAL LOW (ref 0.1–0.5)
TOTAL PROTEIN: 7.4 g/dL (ref 6.5–8.1)
Total Bilirubin: 0.3 mg/dL (ref 0.3–1.2)

## 2016-10-11 LAB — I-STAT BETA HCG BLOOD, ED (NOT ORDERABLE): I-stat hCG, quantitative: <5 m[IU]/mL (ref <5)

## 2016-10-11 LAB — POCT I-STAT TROPONIN I: TROPONIN I, POC: 0 ng/mL (ref 0.00–0.08)

## 2016-10-11 LAB — LIPASE, BLOOD: Lipase: 32 U/L (ref 11–51)

## 2016-10-11 MED ORDER — MAGNESIUM CITRATE PO SOLN
1.0000 | Freq: Once | ORAL | Status: DC
  Filled 2016-10-11: qty 296

## 2016-10-11 NOTE — ED Notes (Signed)
Informed pt we need a urine specimen and she said she was unable to urinate

## 2016-10-11 NOTE — ED Notes (Signed)
Pt stated that the IV/IV fluids were making her chest hurt

## 2016-10-11 NOTE — ED Provider Notes (Signed)
[facility] DEPT Provider Note   CSN: [id number] Arrival date & time: 10/10/16  2321     History   Chief Complaint Chief Complaint  Patient presents with  . Chest Pain  . Back Pain  . Abrasion  . Abdominal Pain    HPI Tina Hale is a 21 y.o. female.  HPI  Tina Hale is a 21 y.o. femalewith history of anxiety, depression, prior head trauma, headaches, anemia, presents to emergency department complaining of chest pain, abdominal pain, nausea, vomiting. This is a chronic issue for this patient.She has been seen for intermittent chest pains for years. She states that pain is severe, sharp, center of her chest radiates to the right. She was seen for the same 4 days ago, that time negative d-dimer, negative workup. Patient denies any shortness of breath. She does report nausea and vomiting all day today. She reports some upper abdominal pain. Denies any diarrhea. No blood in her stool or emesis. She reports that her last normal bowel movement was 2 weeks ago and states she is constipated. She denies any injuries.   Past Medical History:  Diagnosis Date  . ADD (attention deficit disorder)   . ADHD (attention deficit hyperactivity disorder)   . Anemia   . Anger   . Anxiety   . Asthma   . Depression   . Head trauma   . Headache   . Memory loss   . PTSD (post-traumatic stress disorder)     Patient Active Problem List   Diagnosis Date Noted  . Nexplanon removal 08/01/2015  . Nexplanon insertion 08/01/2015  . 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 Migraine without aura and without status migrainosus, not intractable 06/01/2014  . Gait disorder 06/01/2014  . Persistent vomiting 05/04/2014  . Left hip pain 05/04/2014  . Closed head injury with brief loss of consciousness ([place]) 05/04/2014  . New daily persistent headache 03/29/2014  . Neck pain, bilateral 03/29/2014  . Stiff neck 03/29/2014  . Monocular diplopia 03/29/2014  . Left sided  numbness 03/29/2014  . Headaches 03/23/2014  . Family circumstance 11/24/2013  . Failed vision screen 11/24/2013  . Obesity 11/24/2013  . Depression 11/24/2013  . Attention deficit hyperactivity disorder 11/24/2013  . Suicidal ideation 11/22/2012  . Homicidal ideation 11/22/2012  . Depression with anxiety 11/22/2012  . Obsessive compulsive disorder 11/22/2012  . 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 Parent-child relational problem 11/22/2012    No past surgical history on file.  OB History    Gravida Para Term Preterm AB Living   0 0 0 0 0     SAB TAB Ectopic Multiple Live Births   0 0 0           Home Medications    Prior to Admission medications   Medication Sig Start Date End Date Taking? Authorizing Provider  buPROPion (WELLBUTRIN XL) 150 MG 24 hr tablet Take 150 mg by mouth daily.   Yes [provider]  cloNIDine (CATAPRES) 0.1 MG tablet Take 0.1 mg by mouth daily.   Yes [provider]  Fluticasone-Salmeterol (ADVAIR) 250-50 MCG/DOSE AEPB Inhale 1 puff into the lungs 2 (two) times daily.   Yes [provider]  ibuprofen (ADVIL,MOTRIN) 200 MG tablet Take 800 mg by mouth every 6 (six) hours as needed for moderate pain.   Yes [provider]  lamoTRIgine (LAMICTAL) 100 MG tablet Take 100 mg by mouth daily. 07/11/15  Yes [provider]  methylphenidate (METADATE CD) 20 MG CR capsule Take 20 mg by mouth every evening.   Yes [provider]  methylphenidate 36 MG PO CR tablet Take 2 tablets (72mg ) by mouth every morning. 11/18/14  Yes [provider]  traZODone (DESYREL) 100 MG tablet Take 100 mg by mouth at bedtime.   Yes [provider]  albuterol (PROVENTIL HFA;VENTOLIN HFA) 108 (90 BASE) MCG/ACT inhaler Inhale 2 puffs into the lungs every 6 (six) hours as needed for wheezing or shortness of breath. Patient not taking: Reported on 10/11/2016 11/24/13   Ok Edwards, MD  beclomethasone (QVAR) 80 MCG/ACT inhaler Inhale 2 puffs into the lungs 2 (two) times daily. Patient not taking: Reported on 10/07/2016 11/24/13   Ok Edwards, MD  cetirizine (ZYRTEC) 10 MG tablet Take 1 tablet (10 mg total) by mouth daily. Patient not taking: Reported on 10/11/2016 12/23/13   Ok Edwards, MD  loratadine (CLARITIN) 10 MG tablet Take 1 tablet (10 mg total) by mouth daily. Patient not taking: Reported on 10/07/2016 04/13/15   Montine Circle, PA-C  traMADol (ULTRAM) 50 MG tablet Take 1 tablet (50 mg total) by mouth every 6 (six) hours as needed. 8/85/02   Delora Fuel, MD    Family History Family History  Problem Relation Age of Onset  . High blood pressure Mother   . High blood pressure Father   . Cancer Maternal Grandmother     Social History Social History  Substance Use Topics  . Smoking status: Passive Smoke Exposure - Never Smoker  . Smokeless tobacco: Never Used     Comment: Step dad smokes  . Alcohol use No     Allergies   Amoxicillin; Ethyl alcohol (skin cleanser); Other; and Tomato   Review of Systems Review of Systems  Constitutional: Negative for chills and fever.  Respiratory: Negative for cough, chest tightness and shortness of breath.   Cardiovascular: Positive for chest pain. Negative for palpitations and leg swelling.  Gastrointestinal: Positive for abdominal pain, nausea and vomiting. Negative for diarrhea.  Genitourinary: Negative for dysuria, flank pain, pelvic pain, vaginal bleeding, vaginal discharge and vaginal pain.  Musculoskeletal: Negative for arthralgias, myalgias, neck pain and neck stiffness.  Skin: Negative for rash.  Neurological: Negative for dizziness, weakness and headaches.  All other systems reviewed and are negative.    Physical Exam Updated Vital Signs BP 112/70   Pulse 73   Resp (!) 24   SpO2 99%   Physical Exam  Constitutional: She is oriented to person, place, and time. She appears  well-developed and well-nourished. No distress.  HENT:  Head: Normocephalic.  Eyes: Conjunctivae are normal.  Neck: Neck supple.  Cardiovascular: Normal rate, regular rhythm and normal heart sounds.   Pulmonary/Chest: Effort normal and breath sounds normal. No respiratory distress. She has no wheezes. She has no rales. She exhibits tenderness.  Right-sided chest wall tenderness  Abdominal: Soft. Bowel sounds are normal. She exhibits no distension. There is tenderness. There is no rebound.  Right upper quadrant tenderness  Musculoskeletal: She exhibits no edema.  Neurological: She is alert and oriented to person, place, and time.  Skin: Skin is warm and dry.  Psychiatric: She has a normal mood and affect. Her behavior is normal.  Nursing note and vitals reviewed.    ED Treatments / Results  Labs (all labs ordered are listed, but only abnormal results are displayed) Labs Reviewed  HEPATIC FUNCTION PANEL - Abnormal; Notable for the following:  Result Value   Bilirubin, Direct <0.1 (*)    All other components within normal limits  BASIC METABOLIC PANEL  CBC  LIPASE, BLOOD  URINALYSIS, ROUTINE W REFLEX MICROSCOPIC  I-STAT TROPONIN, ED  I-STAT BETA HCG BLOOD, ED ([facility], [facility], [facility] ONLY)  POCT I-STAT TROPONIN I  I-STAT BETA HCG BLOOD, ED (NOT ORDERABLE)    EKG  EKG Interpretation  Date/Time:  Thursday October 10 2016 23:32:37 EDT Ventricular Rate:  75 PR Interval:    QRS Duration: 75 QT Interval:  389 QTC Calculation: 435 R Axis:   45 Text Interpretation:  Sinus rhythm Normal ECG Confirmed by Molpus, John [phone]) on 10/10/2016 11:51:39 PM       Radiology Dg Chest 2 View  Result Date: 10/10/2016 CLINICAL DATA:  Chest pain EXAM: CHEST  2 VIEW COMPARISON:  10/07/2016 FINDINGS: The heart size and mediastinal contours are within normal limits. Both lungs are clear. The visualized skeletal structures are unremarkable. IMPRESSION: No active cardiopulmonary disease.  Electronically Signed   By: Donavan Foil M.D.   On: 10/10/2016 23:59   Dg Abd 2 Views  Result Date: 10/11/2016 CLINICAL DATA:  Abdominal pain.  Nausea and vomiting. EXAM: ABDOMEN - 2 VIEW COMPARISON:  None. FINDINGS: The lung bases are clear. There is no free intra-abdominal air. No dilated bowel loops to suggest obstruction. Moderate volume of stool throughout the colon. There are pelvic phleboliths. No radiopaque calculi. No acute osseous abnormalities are seen. IMPRESSION: Moderate colonic stool burden, can be seen with constipation. No bowel obstruction. No free air. Electronically Signed   By: Jeb Levering M.D.   On: 10/11/2016 07:01   US Abdomen Limited Ruq  Result Date: 10/11/2016 CLINICAL DATA:  21 y/o  F; abdominal pain. EXAM: ULTRASOUND ABDOMEN LIMITED RIGHT UPPER QUADRANT COMPARISON:  05/30/2016 PET-CT FINDINGS: Gallbladder: No gallstones or wall thickening visualized. No sonographic Murphy sign noted by sonographer. Common bile duct: Diameter: 3.6 mm Liver: No focal lesion identified. Within normal limits in parenchymal echogenicity. Portal vein is patent on color Doppler imaging with normal direction of blood flow towards the liver. IMPRESSION: No acute process identified. Unremarkable right upper quadrant ultrasound. Electronically Signed   By: Kristine Garbe M.D.   On: 10/11/2016 06:49    Procedures Procedures (including critical care time)  Medications Ordered in ED Medications  magnesium citrate solution 1 Bottle (not administered)  ondansetron (ZOFRAN-ODT) disintegrating tablet 4 mg (4 mg Oral Given 10/10/16 2344)     Initial Impression / Assessment and Plan / ED Course  I have reviewed the triage vital signs and the nursing notes.  Pertinent labs & imaging results that were available during my care of the patient were reviewed by me and considered in my medical decision making (see chart for details).     Patient with recurrent chest pain, right upper  quadrant abdominal pain, nausea, vomiting multiple prior workups for the chest pain including CT angiography months ago, negative d-dimer 4 days ago, multiple chest x-rays and EKGs. Review blood work today is unremarkable. Did not repeat d-dimer since it was negative just 2 days ago. In fact I have seen this patient for similar chest pain 3 years ago. Chest x-ray today is negative as well. Given new right upper quadrant pain with tenderness and nausea and vomiting: Get right upper quadrant ultrasound. She states her last bowel movement was 2 weeks ago, we'll get a plain abdomen film to evaluate for constipation versus small bowel obstruction.  7:16 AM Right upper quadrant ultrasound  is negative. X-ray showing constipation. Will discharge home with magnesium citrate. Advised to take a stool softener such as MiraLAX or Colace daily. Try enemas. Follow-up with family doctor as needed. Tylenol for pain.  Vitals:   10/10/16 2323 10/11/16 0249 10/11/16 0300 10/11/16 0345  BP:  (!) 120/91 109/75 112/70  Pulse:  79 72 73  Resp:  (!) 39 (!) 21 (!) 24  SpO2: 98% 100% 100% 99%     Final Clinical Impressions(s) / ED Diagnoses   Final diagnoses:  Abdominal pain  Abdominal pain    New Prescriptions New Prescriptions   No medications on file     Jeannett Senior, Hershal Coria 10/11/16 0720    Molpus, Jenny Reichmann, MD 10/11/16 [phone]

## 2016-10-11 NOTE — ED Notes (Signed)
Pt declined d/c vs.

## 2016-10-11 NOTE — ED Notes (Signed)
Pt vomited in room

## 2016-10-11 NOTE — Discharge Instructions (Signed)
You symptoms are likely due to constipation.  Please take Magnesium Citrate, eat food high in fiber, and take tylenol as needed for pain.  Follow up with your doctor for further care but do not hesitate to return if your condition worsen.

## 2017-03-20 ENCOUNTER — Encounter (HOSPITAL_COMMUNITY): Payer: Self-pay | Admitting: Emergency Medicine

## 2017-03-20 ENCOUNTER — Emergency Department (HOSPITAL_COMMUNITY)
Admission: EM | Admit: 2017-03-20 | Discharge: 2017-03-20 | Disposition: A | Discharge status: Home / Self Care | Payer: BLUE CROSS/BLUE SHIELD | Attending: Emergency Medicine | Admitting: Emergency Medicine

## 2017-03-20 DIAGNOSIS — R197 Diarrhea, unspecified: Secondary | ICD-10-CM | POA: Diagnosis not present

## 2017-03-20 DIAGNOSIS — Z79899 Other long term (current) drug therapy: Secondary | ICD-10-CM | POA: Diagnosis not present

## 2017-03-20 DIAGNOSIS — Z7722 Contact with and (suspected) exposure to environmental tobacco smoke (acute) (chronic): Secondary | ICD-10-CM | POA: Insufficient documentation

## 2017-03-20 DIAGNOSIS — R112 Nausea with vomiting, unspecified: Secondary | ICD-10-CM | POA: Diagnosis present

## 2017-03-20 DIAGNOSIS — J45909 Unspecified asthma, uncomplicated: Secondary | ICD-10-CM | POA: Insufficient documentation

## 2017-03-20 LAB — COMPREHENSIVE METABOLIC PANEL
ALBUMIN: 3.9 g/dL (ref 3.5–5.0)
ALT: 19 U/L (ref 14–54)
ANION GAP: 6 (ref 5–15)
AST: 23 U/L (ref 15–41)
Alkaline Phosphatase: 73 U/L (ref 38–126)
BILIRUBIN TOTAL: 0.1 mg/dL — AB (ref 0.3–1.2)
BUN: 10 mg/dL (ref 6–20)
CALCIUM: 8.7 mg/dL — AB (ref 8.9–10.3)
CO2: 27 mmol/L (ref 22–32)
Chloride: 106 mmol/L (ref 101–111)
Creatinine, Ser: 0.76 mg/dL (ref 0.44–1.00)
GFR calc Af Amer: >60 mL/min (ref >60)
GFR calc non Af Amer: >60 mL/min (ref >60)
GLUCOSE: 78 mg/dL (ref 65–99)
Potassium: 3.8 mmol/L (ref 3.5–5.1)
Sodium: 139 mmol/L (ref 135–145)
TOTAL PROTEIN: 7.9 g/dL (ref 6.5–8.1)

## 2017-03-20 LAB — CBC
HCT: 39.8 % (ref 36.0–46.0)
Hemoglobin: 12.7 g/dL (ref 12.0–15.0)
MCH: 29 pg (ref 26.0–34.0)
MCHC: 31.9 g/dL (ref 30.0–36.0)
MCV: 90.9 fL (ref 78.0–100.0)
Platelets: 278 10*3/uL (ref 150–400)
RBC: 4.38 MIL/uL (ref 3.87–5.11)
RDW: 14 % (ref 11.5–15.5)
WBC: 3.3 10*3/uL — ABNORMAL LOW (ref 4.0–10.5)

## 2017-03-20 LAB — I-STAT BETA HCG BLOOD, ED (MC, WL, AP ONLY): I-stat hCG, quantitative: <5 m[IU]/mL (ref <5)

## 2017-03-20 LAB — LIPASE, BLOOD: Lipase: 29 U/L (ref 11–51)

## 2017-03-20 MED ORDER — ONDANSETRON 4 MG PO TBDP: 4.0000 mg | ORAL_TABLET | Freq: Once | ORAL | Status: DC | PRN

## 2017-03-20 MED ORDER — DIPHENOXYLATE-ATROPINE 2.5-0.025 MG PO TABS
2.0000 | ORAL_TABLET | Freq: Once | ORAL | Status: AC
  Administered 2017-03-20: 2 via ORAL
  Filled 2017-03-20: qty 2

## 2017-03-20 NOTE — ED Notes (Signed)
Pt sts she is unable to give urine sample at this time

## 2017-03-20 NOTE — ED Provider Notes (Signed)
Patient placed in Quick Look pathway, seen and evaluated   Chief Complaint: N/V/D since sunday  HPI:   Patient and mother both have N/V/D.  One episode of emesis.  Multiple episodes of diarrhea.   Has not peed since Sunday.    ROS: No fevers (one)  Physical Exam:   Gen: No distress, had to be asked to stop playing video games to provider could assess her  Neuro: Awake and Alert  Skin: Warm    Focused Exam: Abdomen is soft, non tender, non distrended.    Initiation of care has begun. The patient has been counseled on the process, plan, and necessity for staying for the completion/evaluation, and the remainder of the medical screening examination  Patient sent back to waiting room with instructions to orally rehydrate.  Not discharged based on not urinatingsince Sunday, concern for dehydration.     Lorin Glass, PA-C 03/20/17 1548    Drenda Freeze, MD 03/20/17 [phone]

## 2017-03-20 NOTE — Discharge Instructions (Signed)
You probably have a viral illness.  Drink plenty of fluids

## 2017-03-20 NOTE — ED Provider Notes (Signed)
[facility] DEPT Provider Note   CSN: [id number] Arrival date & time: 03/20/17  1230     History   Chief Complaint Chief Complaint  Patient presents with  . Emesis  . Diarrhea    HPI Tina Hale is a 22 y.o. female.  The history is provided by the patient. No language interpreter was used.  Emesis   This is a new problem. The current episode started more than 2 days ago. The problem occurs 2 to 4 times per day. The problem has not changed since onset.Associated symptoms include diarrhea.  Diarrhea   Associated symptoms include vomiting.  Pt reports multiple episodes of dairrhea.  Pt reports no urine without diarrhea since  Sunday. Past Medical History:  Diagnosis Date  . ADD (attention deficit disorder)   . ADHD (attention deficit hyperactivity disorder)   . Anemia   . Anger   . Anxiety   . Asthma   . Depression   . Head trauma   . Headache   . Memory loss   . PTSD (post-traumatic stress disorder)     Patient Active Problem List   Diagnosis Date Noted  . Nexplanon removal 08/01/2015  . Nexplanon insertion 08/01/2015  . 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 Migraine without aura and without status migrainosus, not intractable 06/01/2014  . Gait disorder 06/01/2014  . Persistent vomiting 05/04/2014  . Left hip pain 05/04/2014  . Closed head injury with brief loss of consciousness ([place]) 05/04/2014  . New daily persistent headache 03/29/2014  . Neck pain, bilateral 03/29/2014  . Stiff neck 03/29/2014  . Monocular diplopia 03/29/2014  . Left sided numbness 03/29/2014  . Headaches 03/23/2014  . Family circumstance 11/24/2013  . Failed vision screen 11/24/2013  . Obesity 11/24/2013  . Depression 11/24/2013  . Attention deficit hyperactivity disorder 11/24/2013  . Suicidal ideation 11/22/2012  . Homicidal ideation 11/22/2012  . Depression with anxiety 11/22/2012  . Obsessive compulsive disorder  11/22/2012  . 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 Parent-child relational problem 11/22/2012    History reviewed. No pertinent surgical history.  OB History    Gravida Para Term Preterm AB Living   0 0 0 0 0     SAB TAB Ectopic Multiple Live Births   0 0 0           Home Medications    Prior to Admission medications   Medication Sig Start Date End Date Taking? Authorizing Provider  buPROPion (WELLBUTRIN XL) 150 MG 24 hr tablet Take 150 mg by mouth daily.    [provider]  cloNIDine (CATAPRES) 0.1 MG tablet Take 0.1 mg by mouth daily.    [provider]  Fluticasone-Salmeterol (ADVAIR) 250-50 MCG/DOSE AEPB Inhale 1 puff into the lungs 2 (two) times daily.    [provider]  ibuprofen (ADVIL,MOTRIN) 200 MG tablet Take 800 mg by mouth every 6 (six) hours as needed for moderate pain.    [provider]  lamoTRIgine (LAMICTAL) 100 MG tablet Take 100 mg by mouth daily. 07/11/15   [provider]  methylphenidate (METADATE CD) 20 MG CR capsule Take 20 mg by mouth every evening.    [provider]  methylphenidate 36 MG PO CR tablet Take 2 tablets (72mg ) by mouth every morning. 11/18/14   [provider]  traMADol (ULTRAM) 50 MG tablet Take 1 tablet (50 mg total) by mouth every 6 (six) hours as needed. 10/07/16  Delora Fuel, MD  traZODone (DESYREL) 100 MG tablet Take 100 mg by mouth at bedtime.    [provider]    Family History Family History  Problem Relation Age of Onset  . High blood pressure Mother   . High blood pressure Father   . Cancer Maternal Grandmother     Social History Social History   Tobacco Use  . Smoking status: Passive Smoke Exposure - Never Smoker  . Smokeless tobacco: Never Used  . Tobacco comment: Step dad smokes  Substance Use Topics  . Alcohol use: No    Alcohol/week: 0.0 oz  . Drug use: No     Allergies   Amoxicillin; Ethyl  alcohol (skin cleanser); Other; and Tomato   Review of Systems Review of Systems  Gastrointestinal: Positive for diarrhea and vomiting.  All other systems reviewed and are negative.    Physical Exam Updated Vital Signs BP 113/63 (BP Location: Left Arm)   Pulse 85   Temp 98.2 F (36.8 C) (Oral)   Resp 20   SpO2 100%   Physical Exam  Constitutional: She appears well-developed and well-nourished. No distress.  HENT:  Head: Normocephalic and atraumatic.  Eyes: Conjunctivae are normal.  Neck: Neck supple.  Cardiovascular: Normal rate and regular rhythm.  No murmur heard. Pulmonary/Chest: Effort normal and breath sounds normal. No respiratory distress.  Abdominal: Soft. There is no tenderness.  Musculoskeletal: She exhibits no edema.  Neurological: She is alert.  Skin: Skin is warm and dry.  Psychiatric: She has a normal mood and affect.  Nursing note and vitals reviewed.    ED Treatments / Results  Labs (all labs ordered are listed, but only abnormal results are displayed) Labs Reviewed  COMPREHENSIVE METABOLIC PANEL - Abnormal; Notable for the following components:      Result Value   Calcium 8.7 (*)    Total Bilirubin 0.1 (*)    All other components within normal limits  CBC - Abnormal; Notable for the following components:   WBC 3.3 (*)    All other components within normal limits  LIPASE, BLOOD  URINALYSIS, ROUTINE W REFLEX MICROSCOPIC  I-STAT BETA HCG BLOOD, ED ([facility], [facility], [facility] ONLY)    EKG  EKG Interpretation None       Radiology No results found.  Procedures Procedures (including critical care time)  Medications Ordered in ED Medications  ondansetron (ZOFRAN-ODT) disintegrating tablet 4 mg (not administered)  diphenoxylate-atropine (LOMOTIL) 2.5-0.025 MG per tablet 2 tablet (not administered)     Initial Impression / Assessment and Plan / ED Course  I have reviewed the triage vital signs and the nursing notes.  Pertinent labs & imaging results  that were available during my care of the patient were reviewed by me and considered in my medical decision making (see chart for details).     ED course.  Pt given zofran and oral fluids no vomitting.  Pt unable to obtain a urine specimen.  Labs obtained.  Pt has normal bun and creatine.  Vital signs are normal.  Pt is non toxic appearing.   Pt given lomotil here to help decrease diarrhea.  Pt denies any uti symptoms or pregnancy.   Pt advised to drink plenty of fluids.  Pt is to return for recheck if not improbing or next 8-12 hours.   Final Clinical Impressions(s) / ED Diagnoses   Final diagnoses:  Nausea vomiting and diarrhea    ED Discharge Orders    None    An After Visit  Summary was printed and given to the patient.    Fransico Meadow, Hershal Coria 03/20/17 Dorthea Cove, MD 03/20/17 [phone]

## 2017-03-20 NOTE — ED Triage Notes (Signed)
Patient c/o n/v/d since Sunday. Denies urinary problems

## 2017-03-20 NOTE — ED Notes (Signed)
Pt given specimen cup and reminded of need for urine. Pt also given ice water.

## 2017-05-06 ENCOUNTER — Other Ambulatory Visit: Payer: Self-pay | Admitting: Cardiothoracic Surgery

## 2017-05-06 DIAGNOSIS — J9859 Other diseases of mediastinum, not elsewhere classified: Secondary | ICD-10-CM

## 2017-06-11 ENCOUNTER — Ambulatory Visit: Payer: BLUE CROSS/BLUE SHIELD | Admitting: Cardiothoracic Surgery

## 2017-06-11 ENCOUNTER — Other Ambulatory Visit: Payer: BLUE CROSS/BLUE SHIELD

## 2017-07-02 ENCOUNTER — Other Ambulatory Visit: Payer: Self-pay

## 2017-07-02 ENCOUNTER — Emergency Department (HOSPITAL_COMMUNITY)
Admission: EM | Admit: 2017-07-02 | Discharge: 2017-07-02 | Disposition: A | Discharge status: Home / Self Care | Payer: BLUE CROSS/BLUE SHIELD | Attending: Emergency Medicine | Admitting: Emergency Medicine

## 2017-07-02 ENCOUNTER — Encounter (HOSPITAL_COMMUNITY): Payer: Self-pay

## 2017-07-02 ENCOUNTER — Ambulatory Visit: Payer: BLUE CROSS/BLUE SHIELD | Admitting: Cardiothoracic Surgery

## 2017-07-02 ENCOUNTER — Other Ambulatory Visit: Payer: BLUE CROSS/BLUE SHIELD

## 2017-07-02 DIAGNOSIS — K0889 Other specified disorders of teeth and supporting structures: Secondary | ICD-10-CM | POA: Diagnosis present

## 2017-07-02 DIAGNOSIS — J45909 Unspecified asthma, uncomplicated: Secondary | ICD-10-CM | POA: Insufficient documentation

## 2017-07-02 DIAGNOSIS — K029 Dental caries, unspecified: Secondary | ICD-10-CM | POA: Insufficient documentation

## 2017-07-02 DIAGNOSIS — Z79899 Other long term (current) drug therapy: Secondary | ICD-10-CM | POA: Diagnosis not present

## 2017-07-02 DIAGNOSIS — Z7722 Contact with and (suspected) exposure to environmental tobacco smoke (acute) (chronic): Secondary | ICD-10-CM | POA: Diagnosis not present

## 2017-07-02 MED ORDER — CLINDAMYCIN HCL 150 MG PO CAPS: 150.0000 mg | ORAL_CAPSULE | Freq: Four times a day (QID) | ORAL | 0 refills | Status: DC

## 2017-07-02 MED ORDER — IBUPROFEN 600 MG PO TABS: 600.0000 mg | ORAL_TABLET | Freq: Four times a day (QID) | ORAL | 0 refills | Status: DC | PRN

## 2017-07-02 NOTE — ED Provider Notes (Signed)
[facility] DEPT Provider Note   CSN: [id number] Arrival date & time: 07/02/17  0849     History   Chief Complaint Chief Complaint  Patient presents with  . Dental Pain    HPI Tina Hale is a 22 y.o. female.  HPI   22 year old female presenting for evaluation of dental pain.  Patient report recurrent pain to her right lower molar ongoing for the past year.  Increasing pain for the past several months.  Pain is sharp stabbing aching with shooting pain throughout her "body".  Pain not adequately relieved with over-the-counter medication.  No fever, chills, trouble swallowing, neck pain or rash.  Denies any recent injury.  She does not have a dentist.  Patient also reports she is currently not sexually active, and is not pregnant.  Past Medical History:  Diagnosis Date  . ADD (attention deficit disorder)   . ADHD (attention deficit hyperactivity disorder)   . Anemia   . Anger   . Anxiety   . Asthma   . Depression   . Head trauma   . Headache   . Memory loss   . PTSD (post-traumatic stress disorder)     Patient Active Problem List   Diagnosis Date Noted  . Nexplanon removal 08/01/2015  . Nexplanon insertion 08/01/2015  . 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 Migraine without aura and without status migrainosus, not intractable 06/01/2014  . Gait disorder 06/01/2014  . Persistent vomiting 05/04/2014  . Left hip pain 05/04/2014  . Closed head injury with brief loss of consciousness ([place]) 05/04/2014  . New daily persistent headache 03/29/2014  . Neck pain, bilateral 03/29/2014  . Stiff neck 03/29/2014  . Monocular diplopia 03/29/2014  . Left sided numbness 03/29/2014  . Headaches 03/23/2014  . Family circumstance 11/24/2013  . Failed vision screen 11/24/2013  . Obesity 11/24/2013  . Depression 11/24/2013  . Attention deficit hyperactivity disorder 11/24/2013  . Suicidal ideation 11/22/2012  .  Homicidal ideation 11/22/2012  . Depression with anxiety 11/22/2012  . Obsessive compulsive disorder 11/22/2012  . 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 Parent-child relational problem 11/22/2012    History reviewed. No pertinent surgical history.   OB History    Gravida  0   Para  0   Term  0   Preterm  0   AB  0   Living        SAB  0   TAB  0   Ectopic  0   Multiple      Live Births               Home Medications    Prior to Admission medications   Medication Sig Start Date End Date Taking? Authorizing Provider  buPROPion (WELLBUTRIN XL) 150 MG 24 hr tablet Take 150 mg by mouth daily.    [provider]  cloNIDine (CATAPRES) 0.1 MG tablet Take 0.1 mg by mouth daily.    [provider]  Fluticasone-Salmeterol (ADVAIR) 250-50 MCG/DOSE AEPB Inhale 1 puff into the lungs 2 (two) times daily.    [provider]  ibuprofen (ADVIL,MOTRIN) 200 MG tablet Take 800 mg by mouth every 6 (six) hours as needed for moderate pain.    [provider]  lamoTRIgine (LAMICTAL) 100 MG tablet Take 100 mg by mouth daily. 07/11/15   [provider]  methylphenidate (METADATE CD) 20 MG CR capsule Take 20 mg by mouth every evening.  [provider]  methylphenidate 36 MG PO CR tablet Take 2 tablets (72mg ) by mouth every morning. 11/18/14   [provider]  traMADol (ULTRAM) 50 MG tablet Take 1 tablet (50 mg total) by mouth every 6 (six) hours as needed. 1/61/09   Delora Fuel, MD  traZODone (DESYREL) 100 MG tablet Take 100 mg by mouth at bedtime.    [provider]    Family History Family History  Problem Relation Age of Onset  . High blood pressure Mother   . High blood pressure Father   . Cancer Maternal Grandmother     Social History Social History   Tobacco Use  . Smoking status: Passive Smoke Exposure - Never Smoker  . Smokeless tobacco: Never Used  .  Tobacco comment: Step dad smokes  Substance Use Topics  . Alcohol use: No    Alcohol/week: 0.0 oz  . Drug use: No     Allergies   Amoxicillin; Ethyl alcohol (skin cleanser); Other; and Tomato   Review of Systems Review of Systems  Constitutional: Negative for fever.  HENT: Positive for dental problem.      Physical Exam Updated Vital Signs BP 124/82 (BP Location: Right Arm)   Pulse 94   Temp (!) 97.4 F (36.3 C) (Oral)   Resp 16   Ht 5\' 6"  (1.676 m)   Wt 102.1 kg (225 lb)   SpO2 100%   BMI 36.32 kg/m   Physical Exam  Constitutional: She appears well-developed and well-nourished. No distress.  HENT:  Head: Atraumatic.  Mouth: Significant dental decay noted to tooth #32 with tenderness to palpation but no gingival erythema and no facial involvement.  No trismus.  Eyes: Conjunctivae are normal.  Neck: Neck supple.  Neurological: She is alert.  Skin: No rash noted.  Psychiatric: She has a normal mood and affect.  Nursing note and vitals reviewed.    ED Treatments / Results  Labs (all labs ordered are listed, but only abnormal results are displayed) Labs Reviewed - No data to display  EKG None  Radiology No results found.  Procedures Procedures (including critical care time)  Medications Ordered in ED Medications - No data to display   Initial Impression / Assessment and Plan / ED Course  I have reviewed the triage vital signs and the nursing notes.  Pertinent labs & imaging results that were available during my care of the patient were reviewed by me and considered in my medical decision making (see chart for details).     BP 124/82 (BP Location: Right Arm)   Pulse 94   Temp (!) 97.4 F (36.3 C) (Oral)   Resp 16   Ht 5\' 6"  (1.676 m)   Wt 102.1 kg (225 lb)   SpO2 100%   BMI 36.32 kg/m    Final Clinical Impressions(s) / ED Diagnoses   Final diagnoses:  Pain due to dental caries    ED Discharge Orders    None     Patient with  dentalgia.  No abscess requiring immediate incision and drainage.  Exam not concerning for Ludwig's angina or pharyngeal abscess.  Will treat with Clindamycin and NSAIDs. Pt instructed to follow-up with dentist.  Discussed return precautions. Pt safe for discharge.    Domenic Moras, PA-C 07/02/17 0945    Varney Biles, MD 07/02/17 [phone]

## 2017-07-02 NOTE — ED Triage Notes (Signed)
Patient c/o right lower dental pain. Right face slightly swollen. Patient states "I have nerve damage to my legs and back from my tooth infection. The pain has traveled all over."

## 2017-07-02 NOTE — ED Notes (Signed)
ED Provider at bedside. 

## 2017-10-28 ENCOUNTER — Encounter (HOSPITAL_COMMUNITY): Payer: Self-pay

## 2017-10-28 ENCOUNTER — Other Ambulatory Visit: Payer: Self-pay

## 2017-10-28 ENCOUNTER — Emergency Department (HOSPITAL_COMMUNITY)
Admission: EM | Admit: 2017-10-28 | Discharge: 2017-10-29 | Disposition: A | Discharge status: Other Acute Inpatient Hospital | Payer: BLUE CROSS/BLUE SHIELD | Source: Home / Self Care | Attending: Emergency Medicine | Admitting: Emergency Medicine

## 2017-10-28 DIAGNOSIS — J45909 Unspecified asthma, uncomplicated: Secondary | ICD-10-CM | POA: Insufficient documentation

## 2017-10-28 DIAGNOSIS — T39312A Poisoning by propionic acid derivatives, intentional self-harm, initial encounter: Secondary | ICD-10-CM

## 2017-10-28 DIAGNOSIS — F4310 Post-traumatic stress disorder, unspecified: Secondary | ICD-10-CM | POA: Diagnosis not present

## 2017-10-28 DIAGNOSIS — M79641 Pain in right hand: Secondary | ICD-10-CM | POA: Diagnosis not present

## 2017-10-28 DIAGNOSIS — R45851 Suicidal ideations: Secondary | ICD-10-CM

## 2017-10-28 DIAGNOSIS — Z79899 Other long term (current) drug therapy: Secondary | ICD-10-CM

## 2017-10-28 DIAGNOSIS — Z008 Encounter for other general examination: Secondary | ICD-10-CM

## 2017-10-28 DIAGNOSIS — T461X2A Poisoning by calcium-channel blockers, intentional self-harm, initial encounter: Secondary | ICD-10-CM | POA: Insufficient documentation

## 2017-10-28 DIAGNOSIS — F332 Major depressive disorder, recurrent severe without psychotic features: Secondary | ICD-10-CM

## 2017-10-28 DIAGNOSIS — Z7722 Contact with and (suspected) exposure to environmental tobacco smoke (acute) (chronic): Secondary | ICD-10-CM

## 2017-10-28 LAB — I-STAT BETA HCG BLOOD, ED (MC, WL, AP ONLY): I-stat hCG, quantitative: <5 m[IU]/mL (ref <5)

## 2017-10-28 LAB — COMPREHENSIVE METABOLIC PANEL
ALBUMIN: 3.7 g/dL (ref 3.5–5.0)
ALK PHOS: 60 U/L (ref 38–126)
ALT: 20 U/L (ref 0–44)
ANION GAP: 6 (ref 5–15)
AST: 20 U/L (ref 15–41)
BILIRUBIN TOTAL: 0.5 mg/dL (ref 0.3–1.2)
BUN: 11 mg/dL (ref 6–20)
CALCIUM: 8.6 mg/dL — AB (ref 8.9–10.3)
CO2: 25 mmol/L (ref 22–32)
Chloride: 107 mmol/L (ref 98–111)
Creatinine, Ser: 0.63 mg/dL (ref 0.44–1.00)
GFR calc Af Amer: >60 mL/min (ref >60)
Glucose, Bld: 117 mg/dL — ABNORMAL HIGH (ref 70–99)
POTASSIUM: 3.9 mmol/L (ref 3.5–5.1)
Sodium: 138 mmol/L (ref 135–145)
TOTAL PROTEIN: 7.8 g/dL (ref 6.5–8.1)

## 2017-10-28 LAB — CBC WITH DIFFERENTIAL/PLATELET
Abs Immature Granulocytes: 0.02 10*3/uL (ref 0.00–0.07)
BASOS ABS: 0 10*3/uL (ref 0.0–0.1)
BASOS PCT: 0 %
EOS ABS: 0 10*3/uL (ref 0.0–0.5)
EOS PCT: 0 %
HEMATOCRIT: 39.5 % (ref 36.0–46.0)
Hemoglobin: 12.1 g/dL (ref 12.0–15.0)
IMMATURE GRANULOCYTES: 0 %
LYMPHS ABS: 2.1 10*3/uL (ref 0.7–4.0)
Lymphocytes Relative: 37 %
MCH: 28.1 pg (ref 26.0–34.0)
MCHC: 30.6 g/dL (ref 30.0–36.0)
MCV: 91.9 fL (ref 80.0–100.0)
Monocytes Absolute: 0.4 10*3/uL (ref 0.1–1.0)
Monocytes Relative: 8 %
NEUTROS PCT: 55 %
NRBC: 0 % (ref 0.0–0.2)
Neutro Abs: 3 10*3/uL (ref 1.7–7.7)
PLATELETS: 275 10*3/uL (ref 150–400)
RBC: 4.3 MIL/uL (ref 3.87–5.11)
RDW: 13.7 % (ref 11.5–15.5)
WBC: 5.6 10*3/uL (ref 4.0–10.5)

## 2017-10-28 LAB — ETHANOL

## 2017-10-28 LAB — SALICYLATE LEVEL: Salicylate Lvl: <7 mg/dL (ref 2.8–30.0)

## 2017-10-28 LAB — ACETAMINOPHEN LEVEL: Acetaminophen (Tylenol), Serum: <10 ug/mL — ABNORMAL LOW (ref 10–30)

## 2017-10-28 NOTE — ED Provider Notes (Signed)
[facility] DEPT Provider Note   CSN: [id number] Arrival date & time: 10/28/17  2125     History   Chief Complaint Chief Complaint  Patient presents with  . Drug Overdose  . Suicidal    HPI Tina Hale is a 22 y.o. female.  22 yo F with a chief complaint of drug overdose.  Patient states that she has been taking her amlodipine and Motrin and increasing doses over the past couple weeks.  Feels that she does does not want to be around.  She has had some issues inside of her family and also thinks that there is a rumor started about her that was untreated that made her increase her suicidality.  States that she has been taking her medications and she feels somewhat blunted by them.  She had a beer a few days ago but denies any other coingestions.  Denies cocaine or other stimulants.  The history is provided by the patient.  Illness  This is a new problem. The current episode started more than 1 week ago. The problem occurs constantly. The problem has not changed since onset.Pertinent negatives include no chest pain, no headaches and no shortness of breath. Nothing aggravates the symptoms. Nothing relieves the symptoms. She has tried nothing for the symptoms. The treatment provided no relief.    Past Medical History:  Diagnosis Date  . ADD (attention deficit disorder)   . ADHD (attention deficit hyperactivity disorder)   . Anemia   . Anger   . Anxiety   . Asthma   . Depression   . Head trauma   . Headache   . Memory loss   . PTSD (post-traumatic stress disorder)     Patient Active Problem List   Diagnosis Date Noted  . Nexplanon removal 08/01/2015  . Nexplanon insertion 08/01/2015  . 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 Migraine without aura and without status migrainosus, not intractable 06/01/2014  . Gait disorder 06/01/2014  . Persistent vomiting 05/04/2014  . Left hip pain 05/04/2014  . Closed head  injury with brief loss of consciousness ([place]) 05/04/2014  . New daily persistent headache 03/29/2014  . Neck pain, bilateral 03/29/2014  . Stiff neck 03/29/2014  . Monocular diplopia 03/29/2014  . Left sided numbness 03/29/2014  . Headaches 03/23/2014  . Family circumstance 11/24/2013  . Failed vision screen 11/24/2013  . Obesity 11/24/2013  . Depression 11/24/2013  . Attention deficit hyperactivity disorder 11/24/2013  . Suicidal ideation 11/22/2012  . Homicidal ideation 11/22/2012  . Depression with anxiety 11/22/2012  . Obsessive compulsive disorder 11/22/2012  . 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 Parent-child relational problem 11/22/2012    History reviewed. No pertinent surgical history.   OB History    Gravida  0   Para  0   Term  0   Preterm  0   AB  0   Living        SAB  0   TAB  0   Ectopic  0   Multiple      Live Births               Home Medications    Prior to Admission medications   Medication Sig Start Date End Date Taking? Authorizing Provider  AMITRIPTYLINE HCL PO Take 1 tablet by mouth daily.   Yes [provider]  etonogestrel (NEXPLANON) 68 MG IMPL implant 1 each by Subdermal route once.  Yes [provider]  methylphenidate 36 MG PO CR tablet Take 72 mg by mouth daily.  11/18/14  Yes [provider]  PARoxetine (PAXIL) 10 MG tablet Take 10 mg by mouth daily. 09/18/17  Yes [provider]  risperiDONE (RISPERDAL) 0.25 MG tablet Take 0.25 mg by mouth daily. 09/18/17  Yes [provider]  clindamycin (CLEOCIN) 150 MG capsule Take 1 capsule (150 mg total) by mouth every 6 (six) hours. Patient not taking: Reported on 10/28/2017 07/02/17   Domenic Moras, PA-C  ibuprofen (ADVIL,MOTRIN) 600 MG tablet Take 1 tablet (600 mg total) by mouth every 6 (six) hours as needed. Patient not taking: Reported on 10/28/2017 07/02/17   Domenic Moras, PA-C  traMADol (ULTRAM)  50 MG tablet Take 1 tablet (50 mg total) by mouth every 6 (six) hours as needed. Patient not taking: Reported on 81/01/7508 2/58/52   Delora Fuel, MD  traZODone (DESYREL) 100 MG tablet Take 100 mg by mouth at bedtime.    [provider]    Family History Family History  Problem Relation Age of Onset  . High blood pressure Mother   . High blood pressure Father   . Cancer Maternal Grandmother     Social History Social History   Tobacco Use  . Smoking status: Passive Smoke Exposure - Never Smoker  . Smokeless tobacco: Never Used  . Tobacco comment: Step dad smokes  Substance Use Topics  . Alcohol use: No    Alcohol/week: 0.0 standard drinks    Frequency: Never  . Drug use: No     Allergies   Amoxicillin; Ethyl alcohol (skin cleanser); Other; and Tomato   Review of Systems Review of Systems  Constitutional: Negative for chills and fever.  HENT: Negative for congestion and rhinorrhea.   Eyes: Negative for redness and visual disturbance.  Respiratory: Negative for shortness of breath and wheezing.   Cardiovascular: Negative for chest pain and palpitations.  Gastrointestinal: Negative for nausea and vomiting.  Genitourinary: Negative for dysuria and urgency.  Musculoskeletal: Negative for arthralgias and myalgias.  Skin: Negative for pallor and wound.  Neurological: Negative for dizziness and headaches.  Psychiatric/Behavioral: Positive for self-injury.     Physical Exam Updated Vital Signs BP 126/83 (BP Location: Left Arm)   Pulse (!) 116   Temp 98.3 F (36.8 C) (Oral)   Resp 18   Ht 5\' 6"  (1.676 m)   Wt 102.1 kg   SpO2 100%   BMI 36.32 kg/m   Physical Exam  Constitutional: She is oriented to person, place, and time. She appears well-developed and well-nourished. No distress.  HENT:  Head: Normocephalic and atraumatic.  Eyes: Pupils are equal, round, and reactive to light. EOM are normal.  Neck: Normal range of motion. Neck supple.    Cardiovascular: Regular rhythm. Tachycardia present. Exam reveals no gallop and no friction rub.  No murmur heard. Pulmonary/Chest: Effort normal. She has no wheezes. She has no rales.  Abdominal: Soft. She exhibits no distension and no mass. There is no tenderness. There is no guarding.  Musculoskeletal: She exhibits no edema or tenderness.  Neurological: She is alert and oriented to person, place, and time.  Skin: Skin is warm and dry. She is not diaphoretic.  Psychiatric: She has a normal mood and affect. Her behavior is normal. She expresses impulsivity and inappropriate judgment. She expresses suicidal ideation. She expresses no homicidal ideation. She expresses suicidal plans. She expresses no homicidal plans.  Nursing note and vitals reviewed.    ED  Treatments / Results  Labs (all labs ordered are listed, but only abnormal results are displayed) Labs Reviewed  CBC WITH DIFFERENTIAL/PLATELET  COMPREHENSIVE METABOLIC PANEL  ETHANOL  RAPID URINE DRUG SCREEN, HOSP PERFORMED  ACETAMINOPHEN LEVEL  SALICYLATE LEVEL  I-STAT BETA HCG BLOOD, ED ([place], [facility], [facility] ONLY)    EKG EKG Interpretation  Date/Time:  Tuesday October 28 2017 21:34:52 EDT Ventricular Rate:  115 PR Interval:    QRS Duration: 77 QT Interval:  328 QTC Calculation: 454 R Axis:   23 Text Interpretation:  Sinus tachycardia Borderline T abnormalities, inferior leads Since last tracing rate faster Otherwise no significant change Confirmed by Deno Etienne [phone]) on 10/28/2017 9:49:25 PM   Radiology No results found.  Procedures Procedures (including critical care time)  Medications Ordered in ED Medications - No data to display   Initial Impression / Assessment and Plan / ED Course  I have reviewed the triage vital signs and the nursing notes.  Pertinent labs & imaging results that were available during my care of the patient were reviewed by me and considered in my medical decision making (see chart for  details).     22 yo F with a cc of SI.  Going on for the past couple weeks.  Trying to kill herself by overdose.  About 2 and half hours ago she took 5 amlodipine tablets and 7 varying dose sizes of ibuprofen. No symptoms from that. Poison control recommends 6 hour obs.   Turned over to Dr. Leonides Schanz, please see her note for further details of care.   The patients results and plan were reviewed and discussed.   Any x-rays performed were independently reviewed by myself.   Differential diagnosis were considered with the presenting HPI.  Medications - No data to display  Vitals:   10/28/17 2131 10/28/17 2138 10/28/17 2145  BP:  126/83   Pulse:  (!) 116   Resp:  18   Temp:  98.3 F (36.8 C)   TempSrc:  Oral   SpO2: 100% 100%   Weight:   102.1 kg  Height:   5\' 6"  (1.676 m)    Final diagnoses:  Suicidal ideation         Final Clinical Impressions(s) / ED Diagnoses   Final diagnoses:  Suicidal ideation    ED Discharge Orders    None       Deno Etienne, DO 10/28/17 2320

## 2017-10-28 NOTE — ED Notes (Addendum)
Poison control contacted and aware of pt's condition. Per Jess from poison control, pt to be monitored for 6 hours.   According to poison control,monitor for signs and symptoms that include: -drowsiness -tachycardia -inability to void -seizures -nausea -tremors  Tylenol level to be drawn at [place].

## 2017-10-28 NOTE — ED Triage Notes (Signed)
Pt brought by [facility] due to an intentional overdose. Pt took 5 tabs of amitriptyline, 7 tabs of Motrin, 72 mg of Concerta, 1 tablet of Risperdal, and 20 mg of Paxil. Pt is c/o drowsiness.  A&Ox4. Pt is ambulatory.

## 2017-10-29 ENCOUNTER — Inpatient Hospital Stay (HOSPITAL_COMMUNITY)
Admission: AD | Admit: 2017-10-29 | Discharge: 2017-11-05 | DRG: 882 | Disposition: A | Discharge status: Home / Self Care | Payer: BLUE CROSS/BLUE SHIELD | Source: Intra-hospital | Attending: Emergency Medicine | Admitting: Emergency Medicine

## 2017-10-29 ENCOUNTER — Encounter (HOSPITAL_COMMUNITY): Payer: Self-pay | Admitting: *Deleted

## 2017-10-29 ENCOUNTER — Other Ambulatory Visit: Payer: Self-pay

## 2017-10-29 DIAGNOSIS — Z91018 Allergy to other foods: Secondary | ICD-10-CM

## 2017-10-29 DIAGNOSIS — Z62810 Personal history of physical and sexual abuse in childhood: Secondary | ICD-10-CM | POA: Diagnosis present

## 2017-10-29 DIAGNOSIS — Z915 Personal history of self-harm: Secondary | ICD-10-CM

## 2017-10-29 DIAGNOSIS — F4310 Post-traumatic stress disorder, unspecified: Principal | ICD-10-CM | POA: Diagnosis present

## 2017-10-29 DIAGNOSIS — T43632A Poisoning by methylphenidate, intentional self-harm, initial encounter: Secondary | ICD-10-CM | POA: Diagnosis not present

## 2017-10-29 DIAGNOSIS — J45909 Unspecified asthma, uncomplicated: Secondary | ICD-10-CM | POA: Diagnosis present

## 2017-10-29 DIAGNOSIS — F909 Attention-deficit hyperactivity disorder, unspecified type: Secondary | ICD-10-CM | POA: Diagnosis present

## 2017-10-29 DIAGNOSIS — Z91048 Other nonmedicinal substance allergy status: Secondary | ICD-10-CM

## 2017-10-29 DIAGNOSIS — T43012D Poisoning by tricyclic antidepressants, intentional self-harm, subsequent encounter: Secondary | ICD-10-CM | POA: Diagnosis not present

## 2017-10-29 DIAGNOSIS — G4700 Insomnia, unspecified: Secondary | ICD-10-CM | POA: Diagnosis present

## 2017-10-29 DIAGNOSIS — R45851 Suicidal ideations: Secondary | ICD-10-CM | POA: Diagnosis present

## 2017-10-29 DIAGNOSIS — F17210 Nicotine dependence, cigarettes, uncomplicated: Secondary | ICD-10-CM | POA: Diagnosis present

## 2017-10-29 DIAGNOSIS — M79641 Pain in right hand: Secondary | ICD-10-CM | POA: Diagnosis present

## 2017-10-29 DIAGNOSIS — Z818 Family history of other mental and behavioral disorders: Secondary | ICD-10-CM | POA: Diagnosis not present

## 2017-10-29 DIAGNOSIS — T43632D Poisoning by methylphenidate, intentional self-harm, subsequent encounter: Secondary | ICD-10-CM | POA: Diagnosis not present

## 2017-10-29 DIAGNOSIS — T43222D Poisoning by selective serotonin reuptake inhibitors, intentional self-harm, subsequent encounter: Secondary | ICD-10-CM | POA: Diagnosis not present

## 2017-10-29 DIAGNOSIS — Z23 Encounter for immunization: Secondary | ICD-10-CM

## 2017-10-29 DIAGNOSIS — F419 Anxiety disorder, unspecified: Secondary | ICD-10-CM | POA: Diagnosis present

## 2017-10-29 DIAGNOSIS — S6991XA Unspecified injury of right wrist, hand and finger(s), initial encounter: Secondary | ICD-10-CM | POA: Diagnosis present

## 2017-10-29 DIAGNOSIS — R440 Auditory hallucinations: Secondary | ICD-10-CM | POA: Diagnosis not present

## 2017-10-29 DIAGNOSIS — X58XXXA Exposure to other specified factors, initial encounter: Secondary | ICD-10-CM | POA: Diagnosis present

## 2017-10-29 DIAGNOSIS — Z888 Allergy status to other drugs, medicaments and biological substances status: Secondary | ICD-10-CM | POA: Diagnosis not present

## 2017-10-29 DIAGNOSIS — F329 Major depressive disorder, single episode, unspecified: Secondary | ICD-10-CM | POA: Diagnosis present

## 2017-10-29 DIAGNOSIS — T1491XA Suicide attempt, initial encounter: Secondary | ICD-10-CM | POA: Diagnosis not present

## 2017-10-29 DIAGNOSIS — Z79899 Other long term (current) drug therapy: Secondary | ICD-10-CM

## 2017-10-29 DIAGNOSIS — Y9367 Activity, basketball: Secondary | ICD-10-CM | POA: Diagnosis not present

## 2017-10-29 DIAGNOSIS — R451 Restlessness and agitation: Secondary | ICD-10-CM | POA: Diagnosis not present

## 2017-10-29 DIAGNOSIS — T43012A Poisoning by tricyclic antidepressants, intentional self-harm, initial encounter: Secondary | ICD-10-CM | POA: Diagnosis not present

## 2017-10-29 DIAGNOSIS — T39312A Poisoning by propionic acid derivatives, intentional self-harm, initial encounter: Secondary | ICD-10-CM | POA: Diagnosis not present

## 2017-10-29 DIAGNOSIS — T43592A Poisoning by other antipsychotics and neuroleptics, intentional self-harm, initial encounter: Secondary | ICD-10-CM | POA: Diagnosis not present

## 2017-10-29 DIAGNOSIS — T43222A Poisoning by selective serotonin reuptake inhibitors, intentional self-harm, initial encounter: Secondary | ICD-10-CM | POA: Diagnosis not present

## 2017-10-29 DIAGNOSIS — R45850 Homicidal ideations: Secondary | ICD-10-CM | POA: Diagnosis not present

## 2017-10-29 DIAGNOSIS — M549 Dorsalgia, unspecified: Secondary | ICD-10-CM | POA: Diagnosis present

## 2017-10-29 DIAGNOSIS — T39312D Poisoning by propionic acid derivatives, intentional self-harm, subsequent encounter: Secondary | ICD-10-CM | POA: Diagnosis not present

## 2017-10-29 DIAGNOSIS — T43592D Poisoning by other antipsychotics and neuroleptics, intentional self-harm, subsequent encounter: Secondary | ICD-10-CM | POA: Diagnosis not present

## 2017-10-29 LAB — RAPID URINE DRUG SCREEN, HOSP PERFORMED
AMPHETAMINES: NOT DETECTED
BENZODIAZEPINES: NOT DETECTED
Barbiturates: NOT DETECTED
COCAINE: NOT DETECTED
OPIATES: NOT DETECTED
Tetrahydrocannabinol: NOT DETECTED

## 2017-10-29 LAB — ACETAMINOPHEN LEVEL

## 2017-10-29 MED ORDER — INFLUENZA VAC SPLIT QUAD 0.5 ML IM SUSY
0.5000 mL | PREFILLED_SYRINGE | INTRAMUSCULAR | Status: AC
  Administered 2017-10-30: 0.5 mL via INTRAMUSCULAR
  Filled 2017-10-29: qty 0.5

## 2017-10-29 MED ORDER — HYDROXYZINE HCL 25 MG PO TABS: 25.0000 mg | ORAL_TABLET | Freq: Three times a day (TID) | ORAL | Status: DC | PRN

## 2017-10-29 MED ORDER — ACETAMINOPHEN 325 MG PO TABS
650.0000 mg | ORAL_TABLET | Freq: Four times a day (QID) | ORAL | Status: DC | PRN
  Administered 2017-10-30 – 2017-11-05 (×10): 650 mg via ORAL
  Filled 2017-10-29 (×10): qty 2

## 2017-10-29 NOTE — ED Notes (Signed)
Per poison control, resolve tachycardia and repeat EKG before d/c.
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
## 2017-10-29 NOTE — Progress Notes (Signed)
Tina Hale is a 22 year old female pt admitted on voluntary basis. On admission, she reports that she has been feeling depressed and suicidal and reports that she did take overdose in a suicide attempt. On admission, she does endorse depression and passive SI but able to contract for safety in the hospital. She reports that she has been taking her medications as prescribed but reports that she is unsure if they are helping. She denies any substance abuse issues. She reports that she lives with her mother and sister and reports that she will return there once she is discharged. Tina Hale was escorted to the unit, oriented to the milieu and safety maintained.

## 2017-10-29 NOTE — ED Notes (Signed)
Pt ingested a cocktail of medications, Amitriptyline, Concerta, Motrin, and Paxil as SI attempt.  Pt A&O x 3, no distress noted, calm & cooperative at present, resting at present.  Monitoring for safety, sitter at bedside.

## 2017-10-29 NOTE — ED Notes (Signed)
Transported to [facility] by [employer]. All belongings returned to pt. Pt was calm and cooperative.

## 2017-10-29 NOTE — ED Notes (Signed)
Report called to Beverly RN at [facility].   ?

## 2017-10-29 NOTE — Progress Notes (Signed)
Dr. Leonides Schanz, informed of disposition and [facility] recommendation for needed medical clearance.

## 2017-10-29 NOTE — Progress Notes (Signed)
Darryll Capers, Nurse informed of pt disposition.

## 2017-10-29 NOTE — ED Provider Notes (Addendum)
12:00 AM  Assumed care from Dr. Tyrone Nine.  Patient is a 22 year old female who had intentional overdose on ibuprofen and amlodipine around 7 PM last night.  Poison control recommended observation for 6 hours post ingestion.  Patient will need to be reassessed around 1 AM.  2:00 AM  Pt is hemodynamically stable.  Blood pressure in the 100s/60s and heart rate in the 80s.  She complains of sore throat.  Normal ENT exam.  No pharyngeal erythema, petechiae, tonsillar hypertrophy or exudate, stridor, trismus, drooling.  No other complaints.  No abdominal pain.  No nausea or vomiting.  Will discuss with TTS.  She is medically cleared at this time.    4:40 AM  TTS accepts to inpatient after repeat EKG, repeat Tylenol level and HR improved.   4:53 AM  Pt's heart rate is 103.  On review of previous records her heart rates run in the 80s to 100s.  6:37 AM  Pt's repeat Tylenol level is negative.  EKG shows no ischemic abnormality, interval changes.  She is still medically clear.   I reviewed all nursing notes, vitals, pertinent previous records, EKGs, lab and urine results, imaging (as available).      EKG Interpretation  Date/Time:  Wednesday October 29 2017 05:11:50 EDT Ventricular Rate:  98 PR Interval:  148 QRS Duration: 78 QT Interval:  358 QTC Calculation: 457 R Axis:   50 Text Interpretation:  Normal sinus rhythm Normal ECG No significant change since last tracing Confirmed by Donal Lynam, Cyril Mourning [phone]) on 10/29/2017 5:17:51 AM          Kalani Sthilaire, Delice Bison, DO 10/29/17 0518    Marcell Pfeifer, Delice Bison, DO 10/29/17 [phone]

## 2017-10-29 NOTE — Tx Team (Signed)
Initial Treatment Plan 10/29/2017 2:10 PM Sima Matas [id number]    PATIENT STRESSORS: Marital or family conflict Occupational concerns   PATIENT STRENGTHS: Ability for insight Average or above average intelligence Capable of independent living General fund of knowledge Motivation for treatment/growth   PATIENT IDENTIFIED PROBLEMS: Depression Suicidal thoughts "I need to find out why I don't want to be here"                     DISCHARGE CRITERIA:  Ability to meet basic life and health needs Improved stabilization in mood, thinking, and/or behavior Reduction of life-threatening or endangering symptoms to within safe limits Verbal commitment to aftercare and medication compliance  PRELIMINARY DISCHARGE PLAN: Attend aftercare/continuing care group Return to previous living arrangement  PATIENT/FAMILY INVOLVEMENT: This treatment plan has been presented to and reviewed with the patient, Tina Hale, and/or family member, .  The patient and family have been given the opportunity to ask questions and make suggestions.  [place], [place], [place] 10/29/2017, 2:10 PM

## 2017-10-29 NOTE — ED Notes (Signed)
Attempted to call report. RN not available to take report at this time.

## 2017-10-29 NOTE — Progress Notes (Signed)
Kim, AC; Pt needs another EKG, stabilized pulse, and have tylenol level redrawn. Pt is not accepted at [facility] at this time due to needing medical clearance.

## 2017-10-29 NOTE — Progress Notes (Signed)
Adult Psychoeducational Group Note  Date:  10/29/2017 Time:  8:50 PM  Group Topic/Focus:  Wrap-Up Group:   The focus of this group is to help patients review their daily goal of treatment and discuss progress on daily workbooks.  Participation Level:  Active  Participation Quality:  Appropriate  Affect:  Appropriate  Cognitive:  Appropriate  Insight: Appropriate  Engagement in Group:  Engaged  Modes of Intervention:  Discussion  Additional Comments:  The patient expressed that she recently arrived  to [facility].  [place], [place] 10/29/2017, 8:50 PM

## 2017-10-29 NOTE — ED Notes (Addendum)
[place] 1 Black Solo bag, 2 clothing & valuables bags, Fila Fatigue print sneakers. Black socks, fatigue print scarf, Black Jacket, black joggers, black bra, blue top.Marland Kitchen Placed under desk. In TCU.

## 2017-10-29 NOTE — BH Assessment (Addendum)
[place] Assessment Progress Note  Per Lindon Romp, NP, this pt requires psychiatric hospitalization at this time.  Leonia Reader, RN, [facility] has assigned pt to [facility] Rm [id number].  Pt has signed Voluntary Admission and Consent for Treatment, as well as Consent to Release Information to pt's father, mother, sister and brother, as well as an unnamed provider at [facility], and a notification call has been placed to the provider.  Signed forms have been faxed to [facility].  Pt's nurse has been notified, and agrees to send original paperwork along with pt via [place], and to call report to [phone].  Jalene Mullet, [place] [facility] Coordinator [phone]   Addendum:  Per Dimple Nanas, [facility] will be ready to receive pt's at 13:00.  Pt's nurse, Diane, has been notified.  Jalene Mullet, [place] Coordinator [phone]

## 2017-10-30 DIAGNOSIS — T43632A Poisoning by methylphenidate, intentional self-harm, initial encounter: Secondary | ICD-10-CM

## 2017-10-30 DIAGNOSIS — T43592A Poisoning by other antipsychotics and neuroleptics, intentional self-harm, initial encounter: Secondary | ICD-10-CM

## 2017-10-30 DIAGNOSIS — F4310 Post-traumatic stress disorder, unspecified: Principal | ICD-10-CM

## 2017-10-30 DIAGNOSIS — T1491XA Suicide attempt, initial encounter: Secondary | ICD-10-CM

## 2017-10-30 DIAGNOSIS — T43222A Poisoning by selective serotonin reuptake inhibitors, intentional self-harm, initial encounter: Secondary | ICD-10-CM

## 2017-10-30 DIAGNOSIS — T39312A Poisoning by propionic acid derivatives, intentional self-harm, initial encounter: Secondary | ICD-10-CM

## 2017-10-30 DIAGNOSIS — T43012A Poisoning by tricyclic antidepressants, intentional self-harm, initial encounter: Secondary | ICD-10-CM

## 2017-10-30 LAB — LIPID PANEL
CHOL/HDL RATIO: 2.4 ratio
CHOLESTEROL: 111 mg/dL (ref 0–200)
HDL: 46 mg/dL (ref >40)
LDL Cholesterol: 48 mg/dL (ref 0–99)
Triglycerides: 83 mg/dL (ref <150)
VLDL: 17 mg/dL (ref 0–40)

## 2017-10-30 LAB — HEMOGLOBIN A1C
Hgb A1c MFr Bld: 5.2 % (ref 4.8–5.6)
MEAN PLASMA GLUCOSE: 102.54 mg/dL

## 2017-10-30 LAB — TSH: TSH: 1.657 u[IU]/mL (ref 0.350–4.500)

## 2017-10-30 MED ORDER — HYDROXYZINE HCL 50 MG PO TABS: 50.0000 mg | ORAL_TABLET | Freq: Four times a day (QID) | ORAL | Status: DC | PRN

## 2017-10-30 MED ORDER — ZIPRASIDONE MESYLATE 20 MG IM SOLR: 20.0000 mg | Freq: Two times a day (BID) | INTRAMUSCULAR | Status: DC | PRN

## 2017-10-30 MED ORDER — OLANZAPINE 5 MG PO TBDP
5.0000 mg | ORAL_TABLET | Freq: Three times a day (TID) | ORAL | Status: DC | PRN
  Administered 2017-11-02: 5 mg via ORAL
  Filled 2017-10-30: qty 1

## 2017-10-30 MED ORDER — ALBUTEROL SULFATE HFA 108 (90 BASE) MCG/ACT IN AERS
1.0000 | INHALATION_SPRAY | RESPIRATORY_TRACT | Status: DC | PRN
  Administered 2017-10-30: 2 via RESPIRATORY_TRACT
  Administered 2017-11-01: 1 via RESPIRATORY_TRACT
  Administered 2017-11-03 – 2017-11-04 (×4): 2 via RESPIRATORY_TRACT
  Filled 2017-10-30: qty 6.7

## 2017-10-30 MED ORDER — LORAZEPAM 1 MG PO TABS
1.0000 mg | ORAL_TABLET | Freq: Four times a day (QID) | ORAL | Status: DC | PRN
  Administered 2017-11-02 – 2017-11-03 (×2): 1 mg via ORAL
  Filled 2017-10-30 (×2): qty 1

## 2017-10-30 MED ORDER — FLUOXETINE HCL 20 MG PO CAPS
20.0000 mg | ORAL_CAPSULE | Freq: Every day | ORAL | Status: DC
  Administered 2017-10-30 – 2017-11-04 (×6): 20 mg via ORAL
  Filled 2017-10-30 (×9): qty 1

## 2017-10-30 MED ORDER — ONDANSETRON 4 MG PO TBDP
8.0000 mg | ORAL_TABLET | Freq: Three times a day (TID) | ORAL | Status: DC | PRN
  Administered 2017-10-30 – 2017-11-03 (×4): 8 mg via ORAL
  Filled 2017-10-30 (×4): qty 2

## 2017-10-30 MED ORDER — NICOTINE POLACRILEX 2 MG MT GUM: 2.0000 mg | CHEWING_GUM | OROMUCOSAL | Status: DC | PRN

## 2017-10-30 MED ORDER — LORAZEPAM 2 MG/ML IJ SOLN: 1.0000 mg | Freq: Four times a day (QID) | INTRAMUSCULAR | Status: DC | PRN

## 2017-10-30 MED ORDER — OLANZAPINE 10 MG PO TABS
10.0000 mg | ORAL_TABLET | Freq: Every day | ORAL | Status: DC
  Administered 2017-10-30 – 2017-11-04 (×6): 10 mg via ORAL
  Filled 2017-10-30 (×9): qty 1

## 2017-10-30 MED ORDER — PRAZOSIN HCL 1 MG PO CAPS
1.0000 mg | ORAL_CAPSULE | Freq: Every day | ORAL | Status: DC
  Administered 2017-10-30 – 2017-11-01 (×3): 1 mg via ORAL
  Filled 2017-10-30 (×6): qty 1
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
## 2017-10-30 NOTE — Plan of Care (Signed)
  Problem: Medication: Goal: Compliance with prescribed medication regimen will improve Outcome: Progressing   Problem: Safety: Goal: Ability to remain free from injury will improve Outcome: Progressing  DAR NOTE: Patient presents with anxious, irritable affect and depressed mood.  Denies auditory and visual hallucinations but reports passive SI.  Verbally contracts for safety.  Presents with lots of somatic complaints.  Reports nausea, vomiting and pain.  Tylenol 650 mg with good effect. Zofran 8 mg given for complain of nausea and vomiting with good effect.  Rates depression at 10, hopelessness at 10, and anxiety at 10.  Maintained on routine safety checks.  Medications given as prescribed.  Support and encouragement offered as needed.  Attended group and participated.  Patient visible in milieu with minimal interaction with peers.  Patient is safe on and off the unit.
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
## 2017-10-30 NOTE — Progress Notes (Signed)
Recreation Therapy Notes  INPATIENT RECREATION THERAPY ASSESSMENT  Patient Details Name: Blossom Crume MRN: [id number] DOB: 20-Nov-1995 Today's Date: 10/30/2017       Information Obtained From: Patient  Able to Participate in Assessment/Interview: Yes  Patient Presentation: Alert  Reason for Admission (Per Patient): Other (Comments), Suicide Attempt(Paranoid)  Patient Stressors: Other (Comment)(Existing; sister's girlfriend)  Coping Skills:   Child psychotherapist, Dance, Talk  Leisure Interests (2+):  Sports - Basketball, Sports - Football, Art - Production assistant, radio of Recreation/Participation: English as a second language teacher:  No  Expressed Interest in [place]: No  [employer] of Residence:  Investment banker, corporate  Patient Main Form of Transportation: Other (Comment)([employer])  Patient Strengths:  Protecting baby brother; Singing; Dancing  Patient Identified Areas of Improvement:  Dancing; Telling the truth  Patient Goal for Hospitalization:  "Become one with myself, stop thinking negative"  Current SI (including self-harm):  No  Current HI:  No  Current AVH: Yes(Pt stated she could see her little brother sitting in the chair.  Pt also stated she only sees things when she is on [address].)  Staff Intervention Plan: Group Attendance, Collaborate with Interdisciplinary Treatment Team  Consent to Intern Participation: N/A    Victorino Sparrow, LRT/CTRS  Victorino Sparrow A 10/30/2017, 12:44 PM

## 2017-10-30 NOTE — BHH Group Notes (Signed)
LCSW Group Therapy Note  10/30/2017 3:19 PM  Type of Therapy/Topic:  Group Therapy:  Emotion Regulation  Participation Level:  Active   Description of Group:   The purpose of this group is to assist patients in learning to regulate negative emotions and experience positive emotions. Patients will be guided to discuss ways in which they have been vulnerable to their negative emotions. These vulnerabilities will be juxtaposed with experiences of positive emotions or situations, and patients will be challenged to use positive emotions to combat negative ones. Special emphasis will be placed on coping with negative emotions in conflict situations, and patients will process healthy conflict resolution skills.  Therapeutic Goals: 1. Patient will identify two positive emotions or experiences to reflect on in order to balance out   negative emotions 2. Patient will label two or more emotions that they find the most difficult to experience 3. Patient will demonstrate positive conflict resolution skills through discussion and/or role plays  Summary of Patient Progress: Tina Hale attended the entire session. She described irritation as an emotion that she has difficulty trying to balance. Tina Hale reported that "it feels like I do not want to exist". "I am irritated...then I do not want to be at the place I am trying to get help". Tina Hale identified her sister as the main source of her irritation. She left the group, but later returned.  Again expressed irritation, saying she had gone to her room and thrown up, and when she told the nurse, she accused her of making herself throw up.    Therapeutic Modalities:   Cognitive Behavioral Therapy Feelings Identification Dialectical Behavioral Therapy   Lawana Pai MSW Intern 10/30/2017 3:19 PM

## 2017-10-30 NOTE — Progress Notes (Signed)
Adult Psychoeducational Group Note  Date:  10/30/2017 Time:  10:23 PM  Group Topic/Focus:  Wrap-Up Group:   The focus of this group is to help patients review their daily goal of treatment and discuss progress on daily workbooks.  Participation Level:  Active  Participation Quality:  Appropriate  Affect:  Appropriate  Cognitive:  Appropriate  Insight: Appropriate  Engagement in Group:  Engaged  Modes of Intervention:  Discussion  Additional Comments:  Pt had a goal to smile more today and met her goal.  Pt rated the day at a 3/10.  Jedidiah Demartini 10/30/2017, 10:23 PM
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
## 2017-10-30 NOTE — Progress Notes (Signed)
Patient ID: Tina Hale, female   DOB: May 08, 1995, 22 y.o.   MRN: [id number]   D: Patient has a flat affect on approach tonight. Reports some depression. Talks very openly about seeing her brother. She reports seeing him in her room but she talked about not being able to really see him because he is in foster care. Patient not ordered any regular medications tonight but told her to let me know if she needed anything for anxiety or sleep. Patient reports that she is afraid to take anything due to her overdose yesterday. A: Staff will continue to monitor on q 15 minute checks, follow treatment plan, and give medications as ordered. R: Cooperative on the unit.

## 2017-10-30 NOTE — Progress Notes (Signed)
Recreation Therapy Notes  Date: 10.17.19 Time: 1000 Location: [address] Dayroom  Group Topic: Stress Management  Goal Area(s) Addresses:  Patient will verbalize importance of using healthy stress management.  Patient will identify positive emotions associated with healthy stress management.   Intervention: Stress Management  Activity :  Deep Breathing & Guided Imagery.  LRT introduced the stress management techniques of deep breathing and guided imagery.  LRT led patients in the proper way to practice deep breathing.  LRT then played a guided imagery that allowed patients to further relax on the beach at sunset.  Education:  Stress Management, Discharge Planning.   Education Outcome: Acknowledges edcuation/In group clarification offered/Needs additional education  Clinical Observations/Feedback: Pt did not attend group.      Victorino Sparrow, LRT/CTRS         Ria Comment, Chloeann Alfred A 10/30/2017 11:06 AM

## 2017-10-31 DIAGNOSIS — F419 Anxiety disorder, unspecified: Secondary | ICD-10-CM

## 2017-10-31 DIAGNOSIS — F329 Major depressive disorder, single episode, unspecified: Secondary | ICD-10-CM

## 2017-10-31 DIAGNOSIS — R451 Restlessness and agitation: Secondary | ICD-10-CM

## 2017-10-31 DIAGNOSIS — T39312D Poisoning by propionic acid derivatives, intentional self-harm, subsequent encounter: Secondary | ICD-10-CM

## 2017-10-31 DIAGNOSIS — R45851 Suicidal ideations: Secondary | ICD-10-CM

## 2017-10-31 DIAGNOSIS — T43222D Poisoning by selective serotonin reuptake inhibitors, intentional self-harm, subsequent encounter: Secondary | ICD-10-CM

## 2017-10-31 DIAGNOSIS — R441 Visual hallucinations: Secondary | ICD-10-CM

## 2017-10-31 DIAGNOSIS — F17210 Nicotine dependence, cigarettes, uncomplicated: Secondary | ICD-10-CM

## 2017-10-31 DIAGNOSIS — Z638 Other specified problems related to primary support group: Secondary | ICD-10-CM

## 2017-10-31 DIAGNOSIS — R45850 Homicidal ideations: Secondary | ICD-10-CM

## 2017-10-31 DIAGNOSIS — T43012D Poisoning by tricyclic antidepressants, intentional self-harm, subsequent encounter: Secondary | ICD-10-CM

## 2017-10-31 DIAGNOSIS — T43632D Poisoning by methylphenidate, intentional self-harm, subsequent encounter: Secondary | ICD-10-CM

## 2017-10-31 DIAGNOSIS — Z62810 Personal history of physical and sexual abuse in childhood: Secondary | ICD-10-CM

## 2017-10-31 DIAGNOSIS — T43592D Poisoning by other antipsychotics and neuroleptics, intentional self-harm, subsequent encounter: Secondary | ICD-10-CM

## 2017-10-31 DIAGNOSIS — R440 Auditory hallucinations: Secondary | ICD-10-CM

## 2017-10-31 LAB — PROLACTIN: Prolactin: 25.6 ng/mL — ABNORMAL HIGH (ref 4.8–23.3)

## 2017-10-31 NOTE — [facility] Suicide Risk Assessment (Signed)
[place] INPATIENT:  Family/Significant Other Suicide Prevention Education  Suicide Prevention Education:  Education Completed; Lorie Apley, patient's mother, [phone], has been identified by the patient as the family member/significant other with whom the patient will be residing, and identified as the person(s) who will aid the patient in the event of a mental health crisis (suicidal ideations/suicide attempt).  With written consent from the patient, the family member/significant other has been provided the following suicide prevention education, prior to the and/or following the discharge of the patient.  The suicide prevention education provided includes the following:  Suicide risk factors  Suicide prevention and interventions  National Suicide Hotline telephone number  [facility] assessment telephone number  [facility] Emergency Assistance [place] and/or Residential Mobile Crisis Unit telephone number  Request made of family/significant other to:  Remove weapons (e.g., guns, rifles, knives), all items previously/currently identified as safety concern.    Remove drugs/medications (over-the-counter, prescriptions, illicit drugs), all items previously/currently identified as a safety concern.  The family member/significant other verbalizes understanding of the suicide prevention education information provided.  The family member/significant other agrees to remove the items of safety concern listed above.  Cherie Bohaboy 10/31/2017, 4:32 PM

## 2017-10-31 NOTE — Progress Notes (Signed)
D: Patient observed up and fidgety on unit. Complaining of N/V and emesis in toilet observed. Small bits of undigested food observed. Patient confirms this is an ongoing problem PTA and she has never sought treatment for it. She remains attention seeking and somatic. 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 Patient's affect animated, mood preoccupied, anxious. Denies pain.   A: Medicated per orders, prn zofran given with no relief. Medication education provided. Attempted to reach provider for order for MOM but no answer. Patient did not wish to wait for order to be obtained.  Level III obs in place for safety. Emotional support offered and redirection given. Patient encouraged to complete Suicide Safety Plan before discharge. Encouraged to attend and participate in unit programming.   R: Patient verbalizes understanding of POC. Patient denies SI. Continues to endorse HI towards her sister and the "people who have hurt me." Also endorsing AVH of younger brother whom she's not seen in a few years however presentation/behavior is incongruent with her report. Patient remains safe on level III obs. Will continue to monitor throughout the night.

## 2017-10-31 NOTE — Tx Team (Signed)
Interdisciplinary Treatment and Diagnostic Plan Update  10/31/2017 Time of Session: [place] MRN: [id number]  Principal Diagnosis: PTSD (post-traumatic stress disorder)  Secondary Diagnoses: Principal Problem:   PTSD (post-traumatic stress disorder)   Current Medications:  Current Facility-Administered Medications  Medication Dose Route Frequency Provider Last Rate Last Dose  . acetaminophen (TYLENOL) tablet 650 mg  650 mg Oral Q6H PRN Lindon Romp A, NP   650 mg at 10/30/17 1035  . albuterol (PROVENTIL HFA;VENTOLIN HFA) 108 (90 Base) MCG/ACT inhaler 1-2 puff  1-2 puff Inhalation Q4H PRN Lindon Romp A, NP   2 puff at 10/30/17 2136  . FLUoxetine (PROZAC) capsule 20 mg  20 mg Oral QHS Pennelope Bracken, MD   20 mg at 10/30/17 2135  . hydrOXYzine (ATARAX/VISTARIL) tablet 50 mg  50 mg Oral Q6H PRN Pennelope Bracken, MD      . LORazepam (ATIVAN) tablet 1 mg  1 mg Oral Q6H PRN Pennelope Bracken, MD       Or  . LORazepam (ATIVAN) injection 1 mg  1 mg Intramuscular Q6H PRN Pennelope Bracken, MD      . nicotine polacrilex (NICORETTE) gum 2 mg  2 mg Oral Q1H PRN Pennelope Bracken, MD      . OLANZapine (ZYPREXA) tablet 10 mg  10 mg Oral QHS Pennelope Bracken, MD   10 mg at 10/30/17 2135  . OLANZapine zydis (ZYPREXA) disintegrating tablet 5 mg  5 mg Oral Q8H PRN Pennelope Bracken, MD       Or  . ziprasidone (GEODON) injection 20 mg  20 mg Intramuscular Q12H PRN Pennelope Bracken, MD      . ondansetron (ZOFRAN-ODT) disintegrating tablet 8 mg  8 mg Oral Q8H PRN Pennelope Bracken, MD   8 mg at 10/30/17 1613  . prazosin (MINIPRESS) capsule 1 mg  1 mg Oral QHS Pennelope Bracken, MD   1 mg at 10/30/17 2135   PTA Medications: Medications Prior to Admission  Medication Sig Dispense Refill Last Dose  . AMITRIPTYLINE HCL PO Take 1 tablet by mouth daily.   10/28/2017 at Unknown time  . clindamycin (CLEOCIN) 150 MG capsule Take  1 capsule (150 mg total) by mouth every 6 (six) hours. (Patient not taking: Reported on 10/28/2017) 28 capsule 0 Not Taking at Unknown time  . etonogestrel (NEXPLANON) 68 MG IMPL implant 1 each by Subdermal route once.     Marland Kitchen ibuprofen (ADVIL,MOTRIN) 600 MG tablet Take 1 tablet (600 mg total) by mouth every 6 (six) hours as needed. (Patient not taking: Reported on 10/28/2017) 30 tablet 0 Not Taking at Unknown time  . methylphenidate 36 MG PO CR tablet Take 72 mg by mouth daily.   0 10/28/2017 at Unknown time  . PARoxetine (PAXIL) 10 MG tablet Take 10 mg by mouth daily.  2 10/28/2017 at Unknown time  . risperiDONE (RISPERDAL) 0.25 MG tablet Take 0.25 mg by mouth daily.  2 10/28/2017 at Unknown time  . traMADol (ULTRAM) 50 MG tablet Take 1 tablet (50 mg total) by mouth every 6 (six) hours as needed. (Patient not taking: Reported on 10/28/2017) 15 tablet 0 Not Taking at Unknown time  . traZODone (DESYREL) 100 MG tablet Take 100 mg by mouth at bedtime.   unknown    Patient Stressors: Marital or family conflict Occupational concerns  Patient Strengths: Ability for insight Average or above average intelligence Capable of independent living [employer] of knowledge Motivation for treatment/growth  Treatment Modalities:  Medication Management, Group therapy, Case management,  1 to 1 session with clinician, Psychoeducation, Recreational therapy.   Physician Treatment Plan for Primary Diagnosis: PTSD (post-traumatic stress disorder) Long Term Goal(s): Improvement in symptoms so as ready for discharge Improvement in symptoms so as ready for discharge   Short Term Goals: Ability to identify and develop effective coping behaviors will improve Ability to demonstrate self-control will improve  Medication Management: Evaluate patient's response, side effects, and tolerance of medication regimen.  Therapeutic Interventions: 1 to 1 sessions, Unit Group sessions and Medication  administration.  Evaluation of Outcomes: Progressing  Physician Treatment Plan for Secondary Diagnosis: Principal Problem:   PTSD (post-traumatic stress disorder)  Long Term Goal(s): Improvement in symptoms so as ready for discharge Improvement in symptoms so as ready for discharge   Short Term Goals: Ability to identify and develop effective coping behaviors will improve Ability to demonstrate self-control will improve     Medication Management: Evaluate patient's response, side effects, and tolerance of medication regimen.  Therapeutic Interventions: 1 to 1 sessions, Unit Group sessions and Medication administration.  Evaluation of Outcomes: Progressing   RN Treatment Plan for Primary Diagnosis: PTSD (post-traumatic stress disorder) Long Term Goal(s): Knowledge of disease and therapeutic regimen to maintain health will improve  Short Term Goals: Ability to identify and develop effective coping behaviors will improve and Compliance with prescribed medications will improve  Medication Management: RN will administer medications as ordered by provider, will assess and evaluate patient's response and provide education to patient for prescribed medication. RN will report any adverse and/or side effects to prescribing provider.  Therapeutic Interventions: 1 on 1 counseling sessions, Psychoeducation, Medication administration, Evaluate responses to treatment, Monitor vital signs and CBGs as ordered, Perform/monitor CIWA, COWS, AIMS and Fall Risk screenings as ordered, Perform wound care treatments as ordered.  Evaluation of Outcomes: Progressing   LCSW Treatment Plan for Primary Diagnosis: PTSD (post-traumatic stress disorder) Long Term Goal(s): Safe transition to appropriate next level of care at discharge, Engage patient in therapeutic group addressing interpersonal concerns.  Short Term Goals: Engage patient in aftercare planning with referrals and resources, Increase social support  and Increase skills for wellness and recovery  Therapeutic Interventions: Assess for all discharge needs, 1 to 1 time with Social worker, Explore available resources and support systems, Assess for adequacy in community support network, Educate family and significant other(s) on suicide prevention, Complete Psychosocial Assessment, Interpersonal group therapy.  Evaluation of Outcomes: Progressing   Progress in Treatment: Attending groups: Yes. Participating in groups: Yes. Taking medication as prescribed: Yes. Toleration medication: Yes. Family/Significant other contact made: No, will contact:  when given permission Patient understands diagnosis: Yes. Discussing patient identified problems/goals with staff: Yes. Medical problems stabilized or resolved: Yes. Denies suicidal/homicidal ideation: Yes. Issues/concerns per patient self-inventory: No. Other: none  New problem(s) identified: No, Describe:  none  New Short Term/Long Term Goal(s):  Patient Goals:  "to not hurt anybody, calm down when I'm irritated"  Discharge Plan or Barriers:   Reason for Continuation of Hospitalization: Depression Medication stabilization  Estimated Length of Stay: 2-4 days  Attendees: Patient:Tina Hale 10/31/2017   Physician: Dr. Parke Poisson, MD 10/31/2017   Nursing: Baldo Daub, RN 10/31/2017  RN Care Manager: 10/31/2017   Social Worker: Lurline Idol, LCSW 10/31/2017   Recreational Therapist:  10/31/2017   Other:  10/31/2017   Other:  10/31/2017   Other: 10/31/2017        Scribe for Treatment Team: Joanne Chars, [place] 10/31/2017 3:11 PM
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
## 2017-10-31 NOTE — BHH Group Notes (Signed)
  [facility] LCSW Group Therapy Note  Date/Time: 10/31/17, 1315  Type of Therapy/Topic:  Group Therapy:  Emotion Regulation  Participation Level:  None   Mood:  Description of Group:    The purpose of this group is to assist patients in learning to regulate negative emotions and experience positive emotions. Patients will be guided to discuss ways in which they have been vulnerable to their negative emotions. These vulnerabilities will be juxtaposed with experiences of positive emotions or situations, and patients challenged to use positive emotions to combat negative ones. Special emphasis will be placed on coping with negative emotions in conflict situations, and patients will process healthy conflict resolution skills.  Therapeutic Goals: 1. Patient will identify two positive emotions or experiences to reflect on in order to balance out negative emotions:  2. Patient will label two or more emotions that they find the most difficult to experience:  3. Patient will be able to demonstrate positive conflict resolution skills through discussion or role plays:   Summary of Patient Progress: Pt was in group at the beginning but left shortly after it started.       Therapeutic Modalities:   Cognitive Behavioral Therapy Feelings Identification Dialectical Behavioral Therapy  Lurline Idol, LCSW

## 2017-10-31 NOTE — Progress Notes (Signed)
Recreation Therapy Notes  Date: 10.18.19 Time: 0930 Location: [address] Dayroom  Group Topic: Stress Management  Goal Area(s) Addresses:  Patient will verbalize importance of using healthy stress management.  Patient will identify positive emotions associated with healthy stress management.   Intervention: Stress Management  Activity : Progressive Muscle Relaxation.  LRT introduced the stress management technique of progressive muscle relaxation.  LRT read a script to guide patients in tensing each muscle group individually then relaxing them. Patients were to follow along as the script was read.  Education:  Stress Management, Discharge Planning.   Education Outcome: Acknowledges edcuation/In group clarification offered/Needs additional education  Clinical Observations/Feedback: Pt did not attend group.    Tina Hale, LRT/CTRS         Ria Comment, Adelbert Gaspard A 10/31/2017 11:20 AM

## 2017-11-01 MED ORDER — POLYETHYLENE GLYCOL 3350 17 G PO PACK
17.0000 g | PACK | Freq: Every day | ORAL | Status: DC
  Administered 2017-11-01: 17 g via ORAL
  Filled 2017-11-01 (×8): qty 1

## 2017-11-01 MED ORDER — HYDROXYZINE HCL 25 MG PO TABS
25.0000 mg | ORAL_TABLET | Freq: Four times a day (QID) | ORAL | Status: DC | PRN
  Administered 2017-11-01 – 2017-11-04 (×3): 25 mg via ORAL
  Filled 2017-11-01 (×3): qty 1

## 2017-11-01 MED ORDER — BISACODYL 5 MG PO TBEC
5.0000 mg | DELAYED_RELEASE_TABLET | Freq: Every day | ORAL | Status: DC | PRN
  Administered 2017-11-01: 5 mg via ORAL
  Filled 2017-11-01: qty 1
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
## 2017-11-01 NOTE — BHH Group Notes (Signed)
LCSW Group Therapy Note  11/01/2017   10:00-11:00am   Type of Therapy and Topic:  Group Therapy: Anger Cues and Responses  Participation Level:  Did Not Attend   Description of Group:   In this group, patients learned how to recognize the physical, cognitive, emotional, and behavioral responses they have to anger-provoking situations.  They identified a recent time they became angry and how they reacted.  They analyzed how their reaction was possibly beneficial and how it was possibly unhelpful.  The group discussed a variety of healthier coping skills that could help with such a situation in the future.  Deep breathing was practiced briefly.  Therapeutic Goals: 1. Patients will remember their last incident of anger and how they felt emotionally and physically, what their thoughts were at the time, and how they behaved. 2. Patients will identify how their behavior at that time worked for them, as well as how it worked against them. 3. Patients will explore possible new behaviors to use in future anger situations. 4. Patients will learn that anger itself is normal and cannot be eliminated, and that healthier reactions can assist with resolving conflict rather than worsening situations.  Summary of Patient Progress:  Did not attend  Therapeutic Modalities:   Cognitive Behavioral Therapy  Rondi Ivy D Jaylan Hinojosa    

## 2017-11-01 NOTE — Progress Notes (Signed)
Adult Psychoeducational Group Note  Date:  11/01/2017 Time:  1:05 AM  Group Topic/Focus:  Wrap-Up Group:   The focus of this group is to help patients review their daily goal of treatment and discuss progress on daily workbooks.  Participation Level:  Active  Participation Quality:  Appropriate  Affect:  Appropriate  Cognitive:  Appropriate  Insight: Appropriate  Engagement in Group:  Engaged  Modes of Intervention:  Discussion  Additional Comments:  Pt stated she had an okay day.  Pt stated that she wants learn how to not let things fester or get angry.  Pt stated that the other patients are helping her to keep calm and do what is right.  Pt rated the day at a 6/10.  Tina Hale 11/01/2017, 1:05 AM

## 2017-11-01 NOTE — BHH Group Notes (Signed)
[facility] Group Notes:  (Nursing)  Date:  11/01/2017  Time:1:15 PM Type of Therapy:  Nurse Education  Participation Level:  Active  Participation Quality:  Appropriate and Attentive  Affect:  Appropriate  Cognitive:  Alert and Appropriate  Insight:  Appropriate  Engagement in Group:  Engaged  Modes of Intervention:  Discussion and Education  Summary of Progress/Problems: Nurse led Life skills group  Waymond Cera 11/01/2017, 5:16 PM

## 2017-11-01 NOTE — Progress Notes (Signed)
[facility] MD Progress Note  11/01/2017 11:15 AM Rondia Higginbotham  MRN:  [id number]  Subjective: patient reports bilateral feet discomfort, pain. Reports some persistent depression and anxiety. Denies suicidal ideations. Denies medication side effects other than feeling " tired ". Objective : I have reviewed chart notes and have met with patient.22 year old female, lives with mother, employed. Reports history of depression and of PTSD. She was admitted following suicidal attempt, overdose on multiple medication. Reports she was feeling depressed, but also irritable and endorsed several neuro-vegetative symptoms of depression   Patient reports some improvement compared to admission, but still feeling depressed, still feeling subjectively irritable, and ruminative , particularly regarding strained relationship with sister . States sister " gives me a hard time", and states " I wish she would suffer like I have ". Of note, denies current homicidal or violent ideations towards sister. Presents depressed, ruminative.Denies suicidal ideations at this time. Prior to admission was on Concerta, low dose Risperidone, Amitryptiline, Paxil. She is now on Zyprexa , Minipress, Prozac. Denies side effects other than feeling tired . Principal Problem: PTSD (post-traumatic stress disorder)  Diagnosis:   Patient Active Problem List   Diagnosis Date Noted  . PTSD (post-traumatic stress disorder) [F43.10] 10/29/2017  . Nexplanon removal [Z30.46] 08/01/2015  . Nexplanon insertion [T73.220] 08/01/2015  . 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 Migraine without aura and without status migrainosus, not intractable [G43.009] 06/01/2014  . Gait disorder [R26.9] 06/01/2014  . Persistent vomiting [R11.15] 05/04/2014  . Left hip pain [M25.552] 05/04/2014  . Closed head injury with brief loss of consciousness ([place]) [U54.2H0W] 05/04/2014  . New daily persistent headache [G44.52] 03/29/2014   . Neck pain, bilateral [M54.2] 03/29/2014  . Stiff neck [M43.6] 03/29/2014  . Monocular diplopia [H53.2] 03/29/2014  . Left sided numbness [R20.0] 03/29/2014  . Headaches [R51] 03/23/2014  . Family circumstance [Z63.9] 11/24/2013  . Failed vision screen [Z01.01] 11/24/2013  . Obesity [E66.9] 11/24/2013  . Depression [F32.9] 11/24/2013  . Attention deficit hyperactivity disorder [F90.9] 11/24/2013  . Suicidal ideation [R45.851] 11/22/2012  . Homicidal ideation [R45.850] 11/22/2012  . Depression with anxiety [F41.8] 11/22/2012  . Obsessive compulsive disorder [F42.9] 11/22/2012  . 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 Parent-child relational problem [Z62.820] 11/22/2012   Total Time spent with patient: 20 minutes  Past Psychiatric History: See H&P  Past Medical History:  Past Medical History:  Diagnosis Date  . ADD (attention deficit disorder)   . ADHD (attention deficit hyperactivity disorder)   . Anemia   . Anger   . Anxiety   . Asthma   . Depression   . Head trauma   . Headache   . Memory loss   . PTSD (post-traumatic stress disorder)    History reviewed. No pertinent surgical history.  Family History:  Family History  Problem Relation Age of Onset  . High blood pressure Mother   . High blood pressure Father   . Cancer Maternal Grandmother    Family Psychiatric  History: see H&P Social History:  Social History   Substance and Sexual Activity  Alcohol Use No  . Alcohol/week: 0.0 standard drinks  . Frequency: Never     Social History   Substance and Sexual Activity  Drug Use No    Social History   Socioeconomic History  . Marital status: Single    Spouse name: Not on file  . Number of children: Not on file  . Years of  education: Not on file  . Highest education level: Not on file  Occupational History  . Not on file  Social Needs  . Financial resource strain: Not on file  . Food insecurity:     Worry: Not on file    Inability: Not on file  . Transportation needs:    Medical: Not on file    Non-medical: Not on file  Tobacco Use  . Smoking status: Passive Smoke Exposure - Never Smoker  . Smokeless tobacco: Never Used  . Tobacco comment: Step dad smokes  Substance and Sexual Activity  . Alcohol use: No    Alcohol/week: 0.0 standard drinks    Frequency: Never  . Drug use: No  . Sexual activity: Not Currently    Birth control/protection: Implant    Comment: not at present  Lifestyle  . Physical activity:    Days per week: Not on file    Minutes per session: Not on file  . Stress: Not on file  Relationships  . Social connections:    Talks on phone: Not on file    Gets together: Not on file    Attends religious service: Not on file    Active member of club or organization: Not on file    Attends meetings of clubs or organizations: Not on file    Relationship status: Not on file  Other Topics Concern  . 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 She lives with her mother and siblings. She enjoys praise dance and singing in the choir.                ** Merged History Encounter **       Additional Social History:   Sleep: Good  Appetite:  Good  Current Medications: Current Facility-Administered Medications  Medication Dose Route Frequency Provider Last Rate Last Dose  . acetaminophen (TYLENOL) tablet 650 mg  650 mg Oral Q6H PRN Lindon Romp A, NP   650 mg at 10/30/17 1035  . albuterol (PROVENTIL HFA;VENTOLIN HFA) 108 (90 Base) MCG/ACT inhaler 1-2 puff  1-2 puff Inhalation Q4H PRN Lindon Romp A, NP   2 puff at 10/30/17 2136  . bisacodyl (DULCOLAX) EC tablet 5 mg  5 mg Oral Daily PRN Money, Lowry Ram, FNP      . FLUoxetine (PROZAC) capsule 20 mg  20 mg Oral QHS Pennelope Bracken, MD   20 mg at 10/31/17 2107  . hydrOXYzine (ATARAX/VISTARIL) tablet 50 mg  50 mg Oral Q6H PRN Pennelope Bracken, MD      . LORazepam (ATIVAN) tablet 1 mg  1 mg Oral Q6H PRN Pennelope Bracken, MD       Or  . LORazepam (ATIVAN) injection 1 mg  1 mg Intramuscular Q6H PRN Pennelope Bracken, MD      . nicotine polacrilex (NICORETTE) gum 2 mg  2 mg Oral Q1H PRN Pennelope Bracken, MD      . OLANZapine (ZYPREXA) tablet 10 mg  10 mg Oral QHS Pennelope Bracken, MD   10 mg at 10/31/17 2107  . OLANZapine zydis (ZYPREXA) disintegrating tablet 5 mg  5 mg Oral Q8H PRN Pennelope Bracken, MD       Or  . ziprasidone (GEODON) injection 20 mg  20 mg Intramuscular Q12H PRN Pennelope Bracken, MD      . ondansetron (ZOFRAN-ODT) disintegrating tablet 8 mg  8  mg Oral Q8H PRN Pennelope Bracken, MD   8 mg at 10/31/17 2007  . polyethylene glycol (MIRALAX / GLYCOLAX) packet 17 g  17 g Oral Daily Money, Travis B, FNP      . prazosin (MINIPRESS) capsule 1 mg  1 mg Oral QHS Pennelope Bracken, MD   1 mg at 10/31/17 2115   Lab Results:  No results found for this or any previous visit (from the past 48 hour(s)).  Blood Alcohol level:  Lab Results  Component Value Date   ETH <10 10/28/2017   ETH <11 16/10/9602   Metabolic Disorder Labs: Lab Results  Component Value Date   HGBA1C 5.2 10/30/2017   MPG 102.54 10/30/2017   MPG 105 11/23/2013   Lab Results  Component Value Date   PROLACTIN 25.6 (H) 10/30/2017   Lab Results  Component Value Date   CHOL 111 10/30/2017   TRIG 83 10/30/2017   HDL 46 10/30/2017   CHOLHDL 2.4 10/30/2017   VLDL 17 10/30/2017   LDLCALC 48 10/30/2017   LDLCALC 20 11/23/2013    Physical Findings: AIMS: Facial and Oral Movements Muscles of Facial Expression: None, normal Lips and Perioral Area: None, normal Jaw: None, normal Tongue: None, normal,Extremity Movements Upper (arms, wrists, hands, fingers): None, normal Lower (legs, knees, ankles, toes): None, normal, Trunk Movements Neck, shoulders, hips: None, normal, Overall  Severity Severity of abnormal movements (highest score from questions above): None, normal Incapacitation due to abnormal movements: None, normal Patient's awareness of abnormal movements (rate only patient's report): No Awareness, Dental Status Current problems with teeth and/or dentures?: No Does patient usually wear dentures?: No  CIWA:    COWS:     Musculoskeletal: Strength & Muscle Tone: within normal limits Gait & Station: normal Patient leans: N/A  Psychiatric Specialty Exam: Physical Exam  Nursing note and vitals reviewed.   ROS reports some headache, no chest pain, no shortness of breath, reports bilateral foot pain, which she states is chronic, not new , symptom.  Blood pressure (!) 119/98, pulse (!) 125, temperature 98.9 F (37.2 C), temperature source Oral, resp. rate 20, height _0  (1.651 m), weight 106.1 kg, SpO2 100 %.Body mass index is 38.94 kg/m.  General Appearance: fairly groomed   Eye Contact:  fair   Speech:  Normal   Volume:  Decreased   Mood:  Depressed , reports some improvement , but still sad   Affect:  blunted, reports she feels subjectively irritable   Thought Process:  linear, circumstantial  Orientation: alert and attentive  Thought Content:  denies hallucinations at this time, states she has felt that her name is being called , but not today, no delusions expressed   Suicidal Thoughts:  denies suicidal plan or intention of suicide , contracts for safety on unit   Homicidal Thoughts:  No- denies homicidal ideations, and specifically denies current homicidal or violent ideations towards her sister at this time  Memory: recent and remote grossly intact   Judgement:  fair   Insight:  Lacking  Psychomotor Activity:  Normal  Concentration:  Concentration: Fair  Recall:  [employer] of Knowledge:  Fair  Language:  Fair  Akathisia:  No  Handed:  Right  AIMS (if indicated):     Assets:  Resilience Social Support  ADL's:  Intact  Cognition:  WNL   Sleep:  Number of Hours: 6.50      Assessment -  22 year old female, lives with mother, employed. Reports history of depression  and of PTSD. She was admitted following suicidal attempt, overdose on multiple medication. Reports she was feeling depressed, but also irritable and endorsed several neuro-vegetative symptoms of depression  Patient presents vaguely depressed, blunted in affect, presents with somatic concerns and ruminative about stressors, mainly having a poor relationship with her sister. She does endorse some improvement compared to how she felt at admission. At present on Zyprexa for mood disorder, Prozac 20 mgrs QDAY for depression, Minipress 1 mgr QHS for PTSD related nightmares  Treatment Plan Summary: Daily contact with patient to assess and evaluate symptoms and progress in treatment. Treatment Plan reviewed as below today 10/19  Continue Prozac 20 mg po daily for depression, anxiety Decrease  Hydroxyzine to 25  mg po Q 6 hours prn for anxiety  Continue Olanzapine 10 mg po Q hs for mood disorder, psychotic symptomds Continue Minipress 1 mg po Qhs for PTSD related nightmares  Continue Agitation protocols-     - Continue Olanzapine zydis 5 mg po Q hs prn.     or     - Geodon 20 mg IM Q 12 hours prn. Encourage group and milieu participation to work on coping skills and symptom reduction Treatment team working on disposition planning   Jenne Campus, MD   11/01/2017, 11:15 AMPatient ID: Sima Matas, female   DOB: Sep 13, 1995, 22 y.o.   MRN: [id number]

## 2017-11-01 NOTE — Progress Notes (Signed)
D: Pt was in dayroom talking to peers upon initial approach.  Pt presents with anxious affect and mood.  Her goal is "not to cry.  Not to let little things get under my skin.  Not to get irritated."  Pt denies SI/HI, denies hallucinations, reports bilateral foot pain of 10/10.  Pt has been visible in milieu interacting with peers and staff appropriately.  Pt attended evening group.  Pt reports constipation.    A: Introduced self to pt.  Actively listened to pt and offered support and encouragement. Medications administered per order.  PRN medication administered for pain, constipation, and anxiety.  PO fluids encouraged and pt encouraged to ambulate.  Q15 minute safety checks maintained.  R: Pt is safe on the unit.  Pt is compliant with medications.  Pt verbally contracts for safety.  Will continue to monitor and assess.

## 2017-11-01 NOTE — Progress Notes (Signed)
The patient shared in group that she had a good day and that she anticipates being discharged soon. She is unclear about her discharge plans at this time. She credited her positive day to having had support from her peers as well as from attending the groups. Her goal for tomorrow is to not allow certain things to get under her skin.

## 2017-11-02 MED ORDER — PRAZOSIN HCL 2 MG PO CAPS
2.0000 mg | ORAL_CAPSULE | Freq: Every day | ORAL | Status: DC
  Administered 2017-11-02 – 2017-11-04 (×3): 2 mg via ORAL
  Filled 2017-11-02 (×5): qty 1

## 2017-11-02 NOTE — [facility] Group Notes (Signed)
[facility] Group Notes:  (Nursing)  Date:  11/02/2017  Time: 1:15 PM Type of Therapy:  Nurse Education  Participation Level:  Active  Participation Quality:  Appropriate  Affect:  Appropriate  Cognitive:  Alert and Appropriate  Insight:  Appropriate  Engagement in Group:  Engaged  Modes of Intervention:  Discussion and Education  Summary of Progress/Problems:  Nurse led group- [place] 11/02/2017, 6:31 PM

## 2017-11-02 NOTE — Progress Notes (Signed)
Writer has observed patient up in the dayroom laughing and talking with peers. She has came to speak with Probation officer several times about different things. She did not attend group tonight along with a few other girls on the hall and was out in the hallway talking during group. She c/o getting sick today and Probation officer spoke with her about the choices of foods she ate today contributed a lot to her getting sick. Writer encouraged her to make better food choices and the options she has to choose from if foods for meals were not available. Patient had to be re-directed a few times when being intrusive if writer was talking other patients. Support given and safety maintained on unit with 15 min checks.

## 2017-11-02 NOTE — Progress Notes (Addendum)
[facility] MD Progress Note  11/02/2017 2:17 PM Tonnette Zwiebel  MRN:  [id number]  Subjective: patient reports some improvement compared to admission, but reports some lingering symptoms, mainly anxiety, and states she woke up in fear this morning after having had a vivid nightmare last night. Reports chronic feet pain. Denies medication side effects.  Objective : I have reviewed chart notes and have met with patient.22 year old female, lives with mother, employed. Reports history of depression and of PTSD. She was admitted following suicidal attempt, overdose on multiple medication. Endorsed depression, but also irritable and endorsed several neuro-vegetative symptoms of depression.  Patient reports that overall she is feeling better, and is hopeful for discharge soon. She does  present with partially improved range of affect, smiles briefly at times, and is noted to have better eye contact today.  She reports , however, some lingering anxiety, and lingering nightmares . Denies suicidal ideations. Reports intermittent auditory hallucinations, hears her name being called . Today denies violent or homicidal ideations towards her sister. States " I do not want to hurt her". Also states " anyway , my mom told me my sister moved out and will be living with a friend". States " all I want is to have some words with her, I am not going to hurt her" Denies medication side effects .  She has been visible on unit, going to some groups .  Principal Problem: PTSD (post-traumatic stress disorder)  Diagnosis:   Patient Active Problem List   Diagnosis Date Noted  . PTSD (post-traumatic stress disorder) [F43.10] 10/29/2017  . Nexplanon removal [Z30.46] 08/01/2015  . Nexplanon insertion [W09.811] 08/01/2015  . 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 Migraine without aura and without status migrainosus, not intractable [G43.009] 06/01/2014  . Gait disorder [R26.9] 06/01/2014   . Persistent vomiting [R11.15] 05/04/2014  . Left hip pain [M25.552] 05/04/2014  . Closed head injury with brief loss of consciousness ([place]) [B14.7W2N] 05/04/2014  . New daily persistent headache [G44.52] 03/29/2014  . Neck pain, bilateral [M54.2] 03/29/2014  . Stiff neck [M43.6] 03/29/2014  . Monocular diplopia [H53.2] 03/29/2014  . Left sided numbness [R20.0] 03/29/2014  . Headaches [R51] 03/23/2014  . Family circumstance [Z63.9] 11/24/2013  . Failed vision screen [Z01.01] 11/24/2013  . Obesity [E66.9] 11/24/2013  . Depression [F32.9] 11/24/2013  . Attention deficit hyperactivity disorder [F90.9] 11/24/2013  . Suicidal ideation [R45.851] 11/22/2012  . Homicidal ideation [R45.850] 11/22/2012  . Depression with anxiety [F41.8] 11/22/2012  . Obsessive compulsive disorder [F42.9] 11/22/2012  . 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 Parent-child relational problem [Z62.820] 11/22/2012   Total Time spent with patient: 20 minutes  Past Psychiatric History: See H&P  Past Medical History:  Past Medical History:  Diagnosis Date  . ADD (attention deficit disorder)   . ADHD (attention deficit hyperactivity disorder)   . Anemia   . Anger   . Anxiety   . Asthma   . Depression   . Head trauma   . Headache   . Memory loss   . PTSD (post-traumatic stress disorder)    History reviewed. No pertinent surgical history.  Family History:  Family History  Problem Relation Age of Onset  . High blood pressure Mother   . High blood pressure Father   . Cancer Maternal Grandmother    Family Psychiatric  History: see H&P Social History:  Social History   Substance and Sexual Activity  Alcohol Use No  .  Alcohol/week: 0.0 standard drinks  . Frequency: Never     Social History   Substance and Sexual Activity  Drug Use No    Social History   Socioeconomic History  . Marital status: Single    Spouse name: Not on file  . Number of  children: Not on file  . Years of education: Not on file  . Highest education level: Not on file  Occupational History  . Not on file  Social Needs  . Financial resource strain: Not on file  . Food insecurity:    Worry: Not on file    Inability: Not on file  . Transportation needs:    Medical: Not on file    Non-medical: Not on file  Tobacco Use  . Smoking status: Passive Smoke Exposure - Never Smoker  . Smokeless tobacco: Never Used  . Tobacco comment: Step dad smokes  Substance and Sexual Activity  . Alcohol use: No    Alcohol/week: 0.0 standard drinks    Frequency: Never  . Drug use: No  . Sexual activity: Not Currently    Birth control/protection: Implant    Comment: not at present  Lifestyle  . Physical activity:    Days per week: Not on file    Minutes per session: Not on file  . Stress: Not on file  Relationships  . Social connections:    Talks on phone: Not on file    Gets together: Not on file    Attends religious service: Not on file    Active member of club or organization: Not on file    Attends meetings of clubs or organizations: Not on file    Relationship status: Not on file  Other Topics Concern  . 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 She lives with her mother and siblings. She enjoys praise dance and singing in the choir.                ** Merged History Encounter **       Additional Social History:   Sleep: Fair  Appetite:  Good  Current Medications: Current Facility-Administered Medications  Medication Dose Route Frequency Provider Last Rate Last Dose  . acetaminophen (TYLENOL) tablet 650 mg  650 mg Oral Q6H PRN Lindon Romp A, NP   650 mg at 11/02/17 1103  . albuterol (PROVENTIL HFA;VENTOLIN HFA) 108 (90 Base) MCG/ACT inhaler 1-2 puff  1-2 puff Inhalation Q4H PRN Lindon Romp A, NP   1 puff at 11/01/17 1910  . bisacodyl (DULCOLAX) EC tablet 5 mg  5 mg Oral Daily  PRN Money, Lowry Ram, FNP   5 mg at 11/01/17 2216  . FLUoxetine (PROZAC) capsule 20 mg  20 mg Oral QHS Pennelope Bracken, MD   20 mg at 11/01/17 2105  . hydrOXYzine (ATARAX/VISTARIL) tablet 25 mg  25 mg Oral Q6H PRN Sharina Petre, Myer Peer, MD   25 mg at 11/01/17 1936  . LORazepam (ATIVAN) tablet 1 mg  1 mg Oral Q6H PRN Pennelope Bracken, MD   1 mg at 11/02/17 1316   Or  . LORazepam (ATIVAN) injection 1 mg  1 mg Intramuscular Q6H PRN Pennelope Bracken, MD      . nicotine polacrilex (NICORETTE) gum 2 mg  2 mg Oral Q1H PRN Pennelope Bracken, MD      . OLANZapine (ZYPREXA) tablet 10 mg  10 mg Oral QHS [place],  Randa Ngo, MD   10 mg at 11/01/17 2105  . OLANZapine zydis (ZYPREXA) disintegrating tablet 5 mg  5 mg Oral Q8H PRN Pennelope Bracken, MD       Or  . ziprasidone (GEODON) injection 20 mg  20 mg Intramuscular Q12H PRN Pennelope Bracken, MD      . ondansetron (ZOFRAN-ODT) disintegrating tablet 8 mg  8 mg Oral Q8H PRN Pennelope Bracken, MD   8 mg at 11/02/17 1348  . polyethylene glycol (MIRALAX / GLYCOLAX) packet 17 g  17 g Oral Daily Money, Lowry Ram, FNP   17 g at 11/01/17 1306  . prazosin (MINIPRESS) capsule 1 mg  1 mg Oral QHS Pennelope Bracken, MD   1 mg at 11/01/17 2105   Lab Results:  No results found for this or any previous visit (from the past 48 hour(s)).  Blood Alcohol level:  Lab Results  Component Value Date   ETH <10 10/28/2017   ETH <11 93/81/8299   Metabolic Disorder Labs: Lab Results  Component Value Date   HGBA1C 5.2 10/30/2017   MPG 102.54 10/30/2017   MPG 105 11/23/2013   Lab Results  Component Value Date   PROLACTIN 25.6 (H) 10/30/2017   Lab Results  Component Value Date   CHOL 111 10/30/2017   TRIG 83 10/30/2017   HDL 46 10/30/2017   CHOLHDL 2.4 10/30/2017   VLDL 17 10/30/2017   LDLCALC 48 10/30/2017   LDLCALC 20 11/23/2013    Physical Findings: AIMS: Facial and Oral Movements Muscles of Facial  Expression: None, normal Lips and Perioral Area: None, normal Jaw: None, normal Tongue: None, normal,Extremity Movements Upper (arms, wrists, hands, fingers): None, normal Lower (legs, knees, ankles, toes): None, normal, Trunk Movements Neck, shoulders, hips: None, normal, Overall Severity Severity of abnormal movements (highest score from questions above): None, normal Incapacitation due to abnormal movements: None, normal Patient's awareness of abnormal movements (rate only patient's report): No Awareness, Dental Status Current problems with teeth and/or dentures?: No Does patient usually wear dentures?: No  CIWA:    COWS:     Musculoskeletal: Strength & Muscle Tone: within normal limits Gait & Station: normal Patient leans: N/A  Psychiatric Specialty Exam: Physical Exam  Nursing note and vitals reviewed.   ROS reports some headache, no chest pain, no shortness of breath, reports bilateral foot pain, which she states is chronic, not new , symptom.  Blood pressure 123/84, pulse (!) 102, temperature 99.2 F (37.3 C), temperature source Oral, resp. rate 20, height '5\' 5"'$  (1.651 m), weight 106.1 kg, SpO2 100 %.Body mass index is 38.94 kg/m.  General Appearance: improving grooming   Eye Contact:  improving   Speech:  Normal   Volume:  normal    Mood:  improving mood   Affect:  less blunted , not irritable today   Thought Process:  linear, circumstantial  Orientation: alert and attentive  Thought Content:  reports episodes of hearing her name being called, which she states is intermittent, most recently this AM, currently not internally preoccupied    Suicidal Thoughts:  denies suicidal plan or intention of suicide , contracts for safety on unit   Homicidal Thoughts:  No- denies homicidal ideations, and specifically denies current homicidal or violent ideations towards her sister at this time  Memory: recent and remote grossly intact   Judgement:  fair /improving   Insight:  fair    Psychomotor Activity:  Normal  Concentration:  Concentration: improving   Recall:  Fair  Fund of Knowledge:  Fair  Language:  Fair  Akathisia:  No  Handed:  Right  AIMS (if indicated):     Assets:  Resilience Social Support  ADL's:  Intact  Cognition:  WNL  Sleep:  Number of Hours: 6.50      Assessment -  22 year old female, lives with mother, employed. Reports history of depression and of PTSD. She was admitted following suicidal attempt, overdose on multiple medication. Reports she was feeling depressed, but also irritable and endorsed several neuro-vegetative symptoms of depression   Patient reports partial improvement although today reports having had a disturbing, vivid nightmare last night due to which she woke up with some increased anxiety and fear.  Denies suicidal ideations, reports improving mood, and at this time denies homicidal or violent ideations towards her sister, whom she states has moved out of the house . Denies medication side effects  Treatment Plan Summary: Daily contact with patient to assess and evaluate symptoms and progress in treatment. Treatment Plan reviewed as below today 10/20 Continue Prozac 20 mg po daily for depression, anxiety Continue Hydroxyzine to 25  mg po Q 6 hours prn for anxiety  Continue Olanzapine 10 mg po Q hs for mood disorder, psychotic symptomds Increase  Minipress to 2 mg po Qhs for PTSD related nightmares - we reviewed side effects , risk of orthostasis.  Continue Agitation protocols-     - Continue Olanzapine zydis 5 mg po Q hs prn.     or     - Geodon 20 mg IM Q 12 hours prn. Encourage group and milieu participation to work on coping skills and symptom reduction Treatment team working on disposition Reynolds, MD   11/02/2017, 2:17 PM   Patient ID: Sima Matas, female   DOB: 1995-03-26, 22 y.o.   MRN: [id number]

## 2017-11-02 NOTE — [facility] Group Notes (Signed)
[facility] LCSW Group Therapy Note  Date/Time:  11/02/2017 9:00-10:00 or 10:00-11:00AM  Type of Therapy and Topic:  Group Therapy:  Healthy and Unhealthy Supports  Participation Level:  Did Not Attend   Description of Group:  Patients in this group were introduced to the idea of adding a variety of healthy supports to address the various needs in their lives.Patients discussed what additional healthy supports could be helpful in their recovery and wellness after discharge in order to prevent future hospitalizations.   An emphasis was placed on using counselor, doctor, therapy groups, 12-step groups, and problem-specific support groups to expand supports.  They also worked as a group on developing a specific plan for several patients to deal with unhealthy supports through boundary-setting, psychoeducation with loved ones, and even termination of relationships.   Therapeutic Goals:   1)  discuss importance of adding supports to stay well once out of the hospital  2)  compare healthy versus unhealthy supports and identify some examples of each  3)  generate ideas and descriptions of healthy supports that can be added  4)  offer mutual support about how to address unhealthy supports  5)  encourage active participation in and adherence to discharge plan    Summary of Patient Progress:  Did not attend  Therapeutic Modalities:   Motivational Interviewing Brief Solution-Focused Therapy  Merced Brougham D Linkin Vizzini         

## 2017-11-02 NOTE — Progress Notes (Signed)
Patient ID: Tina Hale, female   DOB: [birth year], 22 y.o.   MRN: [id number]    D: Pt has been very flat and depressed on the unit today, she remained in the bed most of the morning. When pt was up she attended all groups and engaged in treatment. Pt did reported that she was still having night terrors, she was seen by Dr. Parke Poisson her medication was increased. After lunch pt complained of some anxiety and nausea, medication was given. Pt reported that medication helped. Pt reported that her depression was a 3, her hopelessness was a 5, and her anxiety was a 1. Pt reported that hetr goal for today was to not let little things get under her skin.  Pt reported being negative SI/HI, no AH/VH noted. A: 15 min checks continued for patient safety. R: Pt safety maintained.

## 2017-11-03 NOTE — Plan of Care (Signed)
Progress Note  D: pt found in the bed; complaining of leg/feet pain that she rates at an 8/10. Pt rates her depression/hopelessness/anxiety a 0/0/1 out of 10 respectively. Pt also complains of lightheadedness, dizziness, and blurred vision. Pt states her goal for today is to not get sick and not to get irritated. Pt will achieve this by staying calm. Pt denies any si/hi/ah/vh and verbally agrees to approach staff if these become apparent or before harming herself while at [facility]. Pt became upset after lunch and the pt's nose started bleeding. Pt states this happens when she starts crying. A: pt provided support and encouragement. Pt given medications per protocol and standing orders. Q1m safety checks implemented and continued.  R: pt safe on the unit. Will continue to monitor.   Pt progressing in the following metrics  Problem: Education: Goal: Knowledge of [place] [facility] Education information/materials will improve Outcome: Progressing   Problem: Activity: Goal: Interest or engagement in activities will improve Outcome: Progressing   Problem: Coping: Goal: Ability to verbalize frustrations and anger appropriately will improve Outcome: Progressing Goal: Ability to demonstrate self-control will improve Outcome: Progressing   Problem: Health Behavior/Discharge Planning: Goal: Identification of resources available to assist in meeting health care needs will improve Outcome: Progressing Goal: Compliance with treatment plan for underlying cause of condition will improve Outcome: Progressing   Problem: Physical Regulation: Goal: Ability to maintain clinical measurements within normal limits will improve Outcome: Progressing   Problem: Education: Goal: Utilization of techniques to improve thought processes will improve Outcome: Progressing Goal: Knowledge of the prescribed therapeutic regimen will improve Outcome: Progressing

## 2017-11-03 NOTE — [facility] Group Notes (Signed)
[place] Group Notes:  (Nursing/MHT/Case Management/Adjunct)  Date:  11/03/2017  Time:  4:15 pm  Type of Therapy:  Psychoeducational Skills  Participation Level:  Active  Participation Quality:  Appropriate  Affect:  Appropriate  Cognitive:  Appropriate  Insight:  Appropriate  Engagement in Group:  Engaged  Modes of Intervention:  Discussion  Summary of Progress/Problems: Patient attended group, listened and responded appropriately.     Cammy Copa 11/03/2017, 6:31 PM

## 2017-11-03 NOTE — Progress Notes (Addendum)
[facility] MD Progress Note  11/03/2017 5:02 PM Tina Hale  MRN:  [id number]  Subjective: Patient states "I am doing all right".  Today focused on discharging soon and on her discharge coinciding with another patient's (for transportation?). States she is feeling better.  Also slept better last night and does not endorse having had nightmares. Presents less somatic and less concerned about chronic pain/bilateral foot pain.  Objective : I have reviewed chart notes and have met with patient. 22 year old female, lives with mother, employed.  Presented to hospital after overdosing on multiple psychiatric medications, described worsening mood/depression in the context of significant psychosocial stressors (limited support system, work-related stressors).  Reported history of PTSD, auditory hallucinations, and endorsed angry, violent ideations of having others suffer as she has, which she states refers mainly to a sister with whom she has significant conflict.  Patient has improved compared to admission and presents with a more reactive affect, better eye contact, seems less guarded, and presents less focused on psychosocial stressors.  She denies suicidal or homicidal ideations. Focused on discharging soon and in particular for her discharge to coincide with a another patient that she states she has a good relationship with.  She has been noted to be more interactive with peers, conversing/laughing with peers during interactions. Currently denies medication side effects.  Currently on Prozac and Zyprexa. Today denies suicidal ideations and currently does not endorse violent or homicidal ideations.    Principal Problem: PTSD (post-traumatic stress disorder)  Diagnosis:   Patient Active Problem List   Diagnosis Date Noted  . PTSD (post-traumatic stress disorder) [F43.10] 10/29/2017  . Nexplanon removal [Z30.46] 08/01/2015  . Nexplanon insertion [J88.416] 08/01/2015  . 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 Migraine without aura and without status migrainosus, not intractable [G43.009] 06/01/2014  . Gait disorder [R26.9] 06/01/2014  . Persistent vomiting [R11.15] 05/04/2014  . Left hip pain [M25.552] 05/04/2014  . Closed head injury with brief loss of consciousness ([place]) [S06.3K1S] 05/04/2014  . New daily persistent headache [G44.52] 03/29/2014  . Neck pain, bilateral [M54.2] 03/29/2014  . Stiff neck [M43.6] 03/29/2014  . Monocular diplopia [H53.2] 03/29/2014  . Left sided numbness [R20.0] 03/29/2014  . Headaches [R51] 03/23/2014  . Family circumstance [Z63.9] 11/24/2013  . Failed vision screen [Z01.01] 11/24/2013  . Obesity [E66.9] 11/24/2013  . Depression [F32.9] 11/24/2013  . Attention deficit hyperactivity disorder [F90.9] 11/24/2013  . Suicidal ideation [R45.851] 11/22/2012  . Homicidal ideation [R45.850] 11/22/2012  . Depression with anxiety [F41.8] 11/22/2012  . Obsessive compulsive disorder [F42.9] 11/22/2012  . 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 Parent-child relational problem [Z62.820] 11/22/2012   Total Time spent with patient: 20 minutes  Past Psychiatric History: See H&P  Past Medical History:  Past Medical History:  Diagnosis Date  . ADD (attention deficit disorder)   . ADHD (attention deficit hyperactivity disorder)   . Anemia   . Anger   . Anxiety   . Asthma   . Depression   . Head trauma   . Headache   . Memory loss   . PTSD (post-traumatic stress disorder)    History reviewed. No pertinent surgical history.  Family History:  Family History  Problem Relation Age of Onset  . High blood pressure Mother   . High blood pressure Father   . Cancer Maternal Grandmother    Family Psychiatric  History: see H&P Social History:  Social History   Substance and Sexual  Activity  Alcohol Use No  . Alcohol/week: 0.0 standard drinks  . Frequency: Never      Social History   Substance and Sexual Activity  Drug Use No    Social History   Socioeconomic History  . Marital status: Single    Spouse name: Not on file  . Number of children: Not on file  . Years of education: Not on file  . Highest education level: Not on file  Occupational History  . Not on file  Social Needs  . Financial resource strain: Not on file  . Food insecurity:    Worry: Not on file    Inability: Not on file  . Transportation needs:    Medical: Not on file    Non-medical: Not on file  Tobacco Use  . Smoking status: Passive Smoke Exposure - Never Smoker  . Smokeless tobacco: Never Used  . Tobacco comment: Step dad smokes  Substance and Sexual Activity  . Alcohol use: No    Alcohol/week: 0.0 standard drinks    Frequency: Never  . Drug use: No  . Sexual activity: Not Currently    Birth control/protection: Implant    Comment: not at present  Lifestyle  . Physical activity:    Days per week: Not on file    Minutes per session: Not on file  . Stress: Not on file  Relationships  . Social connections:    Talks on phone: Not on file    Gets together: Not on file    Attends religious service: Not on file    Active member of club or organization: Not on file    Attends meetings of clubs or organizations: Not on file    Relationship status: Not on file  Other Topics Concern  . 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 She lives with her mother and siblings. She enjoys praise dance and singing in the choir.                ** Merged History Encounter **       Additional Social History:   Sleep: Improving  Appetite:  Good  Current Medications: Current Facility-Administered Medications  Medication Dose Route Frequency Provider Last Rate Last Dose  . acetaminophen (TYLENOL) tablet 650 mg  650 mg Oral Q6H PRN Lindon Romp A, NP   650 mg at 11/03/17 1338  . albuterol (PROVENTIL  HFA;VENTOLIN HFA) 108 (90 Base) MCG/ACT inhaler 1-2 puff  1-2 puff Inhalation Q4H PRN Lindon Romp A, NP   2 puff at 11/03/17 1516  . bisacodyl (DULCOLAX) EC tablet 5 mg  5 mg Oral Daily PRN Money, Lowry Ram, FNP   5 mg at 11/01/17 2216  . FLUoxetine (PROZAC) capsule 20 mg  20 mg Oral QHS Maris Berger T, MD   20 mg at 11/02/17 2119  . hydrOXYzine (ATARAX/VISTARIL) tablet 25 mg  25 mg Oral Q6H PRN Prabhav Faulkenberry, Myer Peer, MD   25 mg at 11/01/17 1936  . LORazepam (ATIVAN) tablet 1 mg  1 mg Oral Q6H PRN Pennelope Bracken, MD   1 mg at 11/02/17 1316   Or  . LORazepam (ATIVAN) injection 1 mg  1 mg Intramuscular Q6H PRN Pennelope Bracken, MD      . nicotine polacrilex (NICORETTE) gum 2 mg  2 mg Oral Q1H PRN Pennelope Bracken, MD      . OLANZapine (ZYPREXA) tablet 10  mg  10 mg Oral QHS Pennelope Bracken, MD   10 mg at 11/02/17 2119  . OLANZapine zydis (ZYPREXA) disintegrating tablet 5 mg  5 mg Oral Q8H PRN Pennelope Bracken, MD   5 mg at 11/02/17 1829   Or  . ziprasidone (GEODON) injection 20 mg  20 mg Intramuscular Q12H PRN Pennelope Bracken, MD      . ondansetron (ZOFRAN-ODT) disintegrating tablet 8 mg  8 mg Oral Q8H PRN Pennelope Bracken, MD   8 mg at 11/02/17 1348  . polyethylene glycol (MIRALAX / GLYCOLAX) packet 17 g  17 g Oral Daily Money, Lowry Ram, [place]   Stopped at 11/03/17 0831  . prazosin (MINIPRESS) capsule 2 mg  2 mg Oral QHS Janat Tabbert, Myer Peer, MD   2 mg at 11/02/17 2119   Lab Results:  No results found for this or any previous visit (from the past 48 hour(s)).  Blood Alcohol level:  Lab Results  Component Value Date   ETH <10 10/28/2017   ETH <11 50/93/2671   Metabolic Disorder Labs: Lab Results  Component Value Date   HGBA1C 5.2 10/30/2017   MPG 102.54 10/30/2017   MPG 105 11/23/2013   Lab Results  Component Value Date   PROLACTIN 25.6 (H) 10/30/2017   Lab Results  Component Value Date   CHOL 111 10/30/2017   TRIG 83  10/30/2017   HDL 46 10/30/2017   CHOLHDL 2.4 10/30/2017   VLDL 17 10/30/2017   LDLCALC 48 10/30/2017   LDLCALC 20 11/23/2013    Physical Findings: AIMS: Facial and Oral Movements Muscles of Facial Expression: None, normal Lips and Perioral Area: None, normal Jaw: None, normal Tongue: None, normal,Extremity Movements Upper (arms, wrists, hands, fingers): None, normal Lower (legs, knees, ankles, toes): None, normal, Trunk Movements Neck, shoulders, hips: None, normal, Overall Severity Severity of abnormal movements (highest score from questions above): None, normal Incapacitation due to abnormal movements: None, normal Patient's awareness of abnormal movements (rate only patient's report): No Awareness, Dental Status Current problems with teeth and/or dentures?: No Does patient usually wear dentures?: No  CIWA:    COWS:     Musculoskeletal: Strength & Muscle Tone: within normal limits Gait & Station: normal Patient leans: N/A  Psychiatric Specialty Exam: Physical Exam  Nursing note and vitals reviewed.   ROS denies headache,  no chest pain, no shortness of breath, reports bilateral foot pain, which she states is chronic, not new , symptom.  Blood pressure (!) 174/132, pulse (!) 130, temperature 98.6 F (37 C), resp. rate 16, height _0  (1.651 m), weight 106.1 kg, SpO2 100 %.Body mass index is 38.94 kg/m.  Repeat BP 130/85 , pulse 108.  General Appearance: improving grooming   Eye Contact:  improving   Speech:  Normal   Volume:  normal    Mood: reports she is feeling better   Affect:  more reactive, smiles at times appropriately   Thought Process:  linear, circumstantial  Orientation: alert and attentive  Thought Content:  today does not endorse auditory hallucinations, and does not appear internally preoccupied   Suicidal Thoughts:  denies suicidal plan or intention of suicide , contracts for safety on unit   Homicidal Thoughts:  No- denies homicidal ideations   Memory: recent and remote grossly intact   Judgement:  fair /improving   Insight:  fair   Psychomotor Activity:  Normal  Concentration:  Concentration: improving   Recall:  [employer] of Knowledge:  Fair  Language:  Fair  Akathisia:  No  Handed:  Right  AIMS (if indicated):     Assets:  Resilience Social Support  ADL's:  Intact  Cognition:  WNL  Sleep:  Number of Hours: 6.50      Assessment -  22 year old female, lives with mother, employed. Reports history of depression and of PTSD. She was admitted following suicidal attempt, overdose on multiple medication. Reports she was feeling depressed, but also irritable and endorsed several neuro-vegetative symptoms of depression   Patient reports improvement compared to admission,presents with a fuller range of affect, and today reporting improved sleep, not endorsing hallucinations, less somatically focused, and presenting future oriented, stating that she is  hoping to discharge soon. Tolerating medications well thus far .  Treatment Plan Summary: Daily contact with patient to assess and evaluate symptoms and progress in treatment. Treatment Plan reviewed as below today 10/21 Continue Prozac 20 mg po daily for depression, anxiety Continue Hydroxyzine to 25  mg po Q 6 hours prn for anxiety  Continue Olanzapine 10 mg po Q hs for mood disorder, psychotic symptomds Continue Minipress  2 mg po Qhs for PTSD related nightmares - we reviewed side effects , risk of orthostasis.  Continue Agitation protocols-     - Continue Olanzapine zydis 5 mg po Q hs prn.     or     - Geodon 20 mg IM Q 12 hours prn. Encourage group and milieu participation to work on coping skills and symptom reduction Treatment team working on disposition planning   Jenne Campus, MD   11/03/2017, 5:02 PM   Patient ID: Sima Matas, female   DOB: October 20, 1995, 22 y.o.   MRN: [id number]

## 2017-11-03 NOTE — Progress Notes (Signed)
Recreation Therapy Notes  Date: 10.21.19 Time: 0930 Location: [address] Dayroom  Group Topic: Stress Management  Goal Area(s) Addresses:  Patient will verbalize importance of using healthy stress management.  Patient will identify positive emotions associated with healthy stress management.   Intervention: Stress Management  Activity : Meditation.  LRT introduced the stress management technique of meditation.  LRT played a meditation that dealt with impermanence.  Patients were to follow along as the meditation played to engage in the meditation.  Education:  Stress Management, Discharge Planning.   Education Outcome: Acknowledges edcuation/In group clarification offered/Needs additional education  Clinical Observations/Feedback: Pt did not attend group.    Chelci [place], LRT/CTRS         Jearl Soto A 11/03/2017 11:31 AM 

## 2017-11-03 NOTE — Progress Notes (Signed)
Pt did not attend wrap-up group   

## 2017-11-03 NOTE — [facility] Group Notes (Signed)
LCSW Group Therapy Note 11/03/2017 2:17 PM  Type of Therapy and Topic: Group Therapy: Overcoming Obstacles  Participation Level: Active  Description of Group:  In this group patients will be encouraged to explore what they see as obstacles to their own wellness and recovery. They will be guided to discuss their thoughts, feelings, and behaviors related to these obstacles. The group will process together ways to cope with barriers, with attention given to specific choices patients can make. Each patient will be challenged to identify changes they are motivated to make in order to overcome their obstacles. This group will be process-oriented, with patients participating in exploration of their own experiences as well as giving and receiving support and challenge from other group members.  Therapeutic Goals: 1. Patient will identify personal and current obstacles as they relate to admission. 2. Patient will identify barriers that currently interfere with their wellness or overcoming obstacles.  3. Patient will identify feelings, thought process and behaviors related to these barriers. 4. Patient will identify two changes they are willing to make to overcome these obstacles:   Summary of Patient Progress  Islay was engaged and participated throughout the group session. Momina states that her main obstacle is not being able to control her emotions. Robin states that she would like to learn how manage her emotions while in the hospital.     Therapeutic Modalities:  Cognitive Behavioral Therapy Solution Focused Therapy Motivational Interviewing Relapse Prevention Therapy   [place] Social Worker

## 2017-11-03 NOTE — Progress Notes (Signed)
Pt observed in the dayroom, seen interacting with peers. Pt attended wrap-up group this evening. Pt appears animated/childlike/intrusive/somatic/attention-seeking in affect and mood. Pt denies SI/HI/AVH/Pain at this time. Pt c/o of nausea and anxiety this evening. PRN ativan, albuterol, vistaril, and zofran requested and given. Support and encouragement provided.Will continue with POC.

## 2017-11-04 ENCOUNTER — Other Ambulatory Visit: Payer: Self-pay

## 2017-11-04 ENCOUNTER — Inpatient Hospital Stay (HOSPITAL_COMMUNITY): Payer: BLUE CROSS/BLUE SHIELD

## 2017-11-04 NOTE — [facility] Group Notes (Signed)
LCSW Group Therapy Note 11/04/2017 12:12 PM  Type of Therapy/Topic: Group Therapy: Feelings about Diagnosis  Participation Level: Active   Description of Group:  This group will allow patients to explore their thoughts and feelings about diagnoses they have received. Patients will be guided to explore their level of understanding and acceptance of these diagnoses. Facilitator will encourage patients to process their thoughts and feelings about the reactions of others to their diagnosis and will guide patients in identifying ways to discuss their diagnosis with significant others in their lives. This group will be process-oriented, with patients participating in exploration of their own experiences, giving and receiving support, and processing challenge from other group members.  Therapeutic Goals: 1. Patient will demonstrate understanding of diagnosis as evidenced by identifying two or more symptoms of the disorder 2. Patient will be able to express two feelings regarding the diagnosis 3. Patient will demonstrate their ability to communicate their needs through discussion and/or role play  Summary of Patient Progress:  Tina Hale was engaged and participated throughout the group session. Tina Hale states that she initially felt misunderstood when she struggled with mental health issues. Tina Hale reports that recently she has learned to accept herself and to not worry about what other people thought.     Therapeutic Modalities:  Cognitive Behavioral Therapy Brief Therapy Feelings Identification    [place] Clinical Social Worker

## 2017-11-04 NOTE — Progress Notes (Addendum)
Patient went to recreation downstairs and played basketball.  Patient returned to [address], stated her hand hurt (R hand).  Her R hand was shaking.  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  MHT stated she played hard the entire time group in recreation and never did complain of any pain/discomfort.  Patient was given cold pack for R hand pain.  Patient was seen by staff sitting in dayroom, talking, laughing with other patients, no signs/symptoms of pain/distress noted on patient's face/body movements.  Patient was also seen leaning on R hand against her face/chin while talking, laughing to peers in [place].  Patient was seen with ice pack on L hand.   Again patient asked to see MD.   Patient has been sitting in dayroom with R hand on ice pack at this time.  Patient is laughing and talking with staff/patients. Patient stated she broke her R hand 2 months ago and put toilet paper wrapped around her hand which helped her hand, and has broken her R hand a total of 3 times.   Patient was given tylenol for pain and vistaril.  Patient stated she was going to tell her mother.

## 2017-11-04 NOTE — ED Provider Notes (Signed)
[facility] DEPT Provider Note   CSN: [id number] Arrival date & time: 11/04/17  1809     History   Chief Complaint Chief Complaint  Patient presents with  . Hand Pain  . Hand Injury    HPI Tina Hale is a 22 y.o. female.  HPI   Patient is a 22 year old female with a history of ADHD, anxiety, depression, asthma, PTSD, who presents the emergency department today from behavioral health for evaluation of right hand pain.  Patient states she was playing basketball earlier today and hyperextended her right thumb multiple times.  Also jammed her thumb while playing basketball.  States she has constant pain to the area.  Worse with movement.  Has tried Tylenol with no relief.  Denies any other pain or injuries.  Past Medical History:  Diagnosis Date  . ADD (attention deficit disorder)   . ADHD (attention deficit hyperactivity disorder)   . Anemia   . Anger   . Anxiety   . Asthma   . Depression   . Head trauma   . Headache   . Memory loss   . PTSD (post-traumatic stress disorder)     Patient Active Problem List   Diagnosis Date Noted  . PTSD (post-traumatic stress disorder) 10/29/2017  . Nexplanon removal 08/01/2015  . Nexplanon insertion 08/01/2015  . 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 Migraine without aura and without status migrainosus, not intractable 06/01/2014  . Gait disorder 06/01/2014  . Persistent vomiting 05/04/2014  . Left hip pain 05/04/2014  . Closed head injury with brief loss of consciousness ([place]) 05/04/2014  . New daily persistent headache 03/29/2014  . Neck pain, bilateral 03/29/2014  . Stiff neck 03/29/2014  . Monocular diplopia 03/29/2014  . Left sided numbness 03/29/2014  . Headaches 03/23/2014  . Family circumstance 11/24/2013  . Failed vision screen 11/24/2013  . Obesity 11/24/2013  . Depression 11/24/2013  . Attention deficit hyperactivity disorder 11/24/2013  . Suicidal  ideation 11/22/2012  . Homicidal ideation 11/22/2012  . Depression with anxiety 11/22/2012  . Obsessive compulsive disorder 11/22/2012  . 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 Parent-child relational problem 11/22/2012    History reviewed. No pertinent surgical history.   OB History    Gravida  0   Para  0   Term  0   Preterm  0   AB  0   Living        SAB  0   TAB  0   Ectopic  0   Multiple      Live Births               Home Medications    Prior to Admission medications   Medication Sig Start Date End Date Taking? Authorizing Provider  AMITRIPTYLINE HCL PO Take 1 tablet by mouth daily.    [provider]  clindamycin (CLEOCIN) 150 MG capsule Take 1 capsule (150 mg total) by mouth every 6 (six) hours. Patient not taking: Reported on 10/28/2017 07/02/17   Domenic Moras, PA-C  etonogestrel (NEXPLANON) 68 MG IMPL implant 1 each by Subdermal route once.    [provider]  ibuprofen (ADVIL,MOTRIN) 600 MG tablet Take 1 tablet (600 mg total) by mouth every 6 (six) hours as needed. Patient not taking: Reported on 10/28/2017 07/02/17   Domenic Moras, PA-C  methylphenidate 36 MG PO CR tablet Take 72 mg by mouth daily.  11/18/14   [provider]  PARoxetine (PAXIL) 10 MG tablet Take 10 mg by mouth daily. 09/18/17   [provider]  risperiDONE (RISPERDAL) 0.25 MG tablet Take 0.25 mg by mouth daily. 09/18/17   [provider]  traMADol (ULTRAM) 50 MG tablet Take 1 tablet (50 mg total) by mouth every 6 (six) hours as needed. Patient not taking: Reported on 56/25/6389 3/73/42   Delora Fuel, MD  traZODone (DESYREL) 100 MG tablet Take 100 mg by mouth at bedtime.    [provider]    Family History Family History  Problem Relation Age of Onset  . High blood pressure Mother   . High blood pressure Father   . Cancer Maternal Grandmother     Social History Social History    Tobacco Use  . Smoking status: Passive Smoke Exposure - Never Smoker  . Smokeless tobacco: Never Used  . Tobacco comment: Step dad smokes  Substance Use Topics  . Alcohol use: No    Alcohol/week: 0.0 standard drinks    Frequency: Never  . Drug use: No     Allergies   Amoxicillin; Ethyl alcohol (skin cleanser); Other; and Tomato   Review of Systems Review of Systems  Constitutional: Negative for fever.  Musculoskeletal:       Right hand pain  Skin: Negative for wound.     Physical Exam Updated Vital Signs BP (!) 134/96   Pulse (!) 108   Temp 98.8 F (37.1 C) (Oral)   Resp 16   Ht 5\' 5"  (1.651 m)   Wt 106.1 kg   SpO2 100%   BMI 38.92 kg/m   Physical Exam  Constitutional: She appears well-developed and well-nourished. No distress.  HENT:  Head: Normocephalic and atraumatic.  Eyes: Conjunctivae are normal.  Neck: Neck supple.  Cardiovascular: Normal rate.  Pulmonary/Chest: Effort normal.  Musculoskeletal:  TTP to the right wrist and at the base of the thumb. No snuff box TTP. No obvious deformity. FROM of the wrist and hand without significant pain. NVI.  Neurological: She is alert.  Skin: Skin is warm and dry.  Psychiatric: She has a normal mood and affect.  Nursing note and vitals reviewed.   ED Treatments / Results  Labs (all labs ordered are listed, but only abnormal results are displayed) Labs Reviewed  PROLACTIN - Abnormal; Notable for the following components:      Result Value   Prolactin 25.6 (*)    All other components within normal limits  HEMOGLOBIN A1C  LIPID PANEL  TSH    EKG None  Radiology Dg Wrist Complete Right  Result Date: 11/04/2017 CLINICAL DATA:  Basketball injury EXAM: RIGHT WRIST - COMPLETE 3+ VIEW COMPARISON:  None. FINDINGS: There is no evidence of fracture or dislocation. There is no evidence of arthropathy or other focal bone abnormality. Soft tissues are unremarkable. IMPRESSION: Negative. Electronically Signed    By: Donavan Foil M.D.   On: 11/04/2017 19:48   Dg Hand Complete Right  Result Date: 11/04/2017 CLINICAL DATA:  Basketball injury EXAM: RIGHT HAND - COMPLETE 3+ VIEW COMPARISON:  None. FINDINGS: There is no evidence of fracture or dislocation. There is no evidence of arthropathy or other focal bone abnormality. Soft tissues are unremarkable. IMPRESSION: Negative. Electronically Signed   By: Donavan Foil M.D.   On: 11/04/2017 19:49    Procedures Procedures (including critical care time)  SPLINT APPLICATION Date/Time: 8:76 PM Authorized by: Rodney Booze Consent: Verbal consent obtained. Risks and benefits: risks, benefits and alternatives were discussed Consent given  by: patient Splint applied by: orthopedic technician Location details: RUE Splint type: thumb spica Post-procedure: The splinted body part was neurovascularly unchanged following the procedure. Patient tolerance: Patient tolerated the procedure well with no immediate complications.   Medications Ordered in ED Medications  acetaminophen (TYLENOL) tablet 650 mg (650 mg Oral Given 11/04/17 1720)  ondansetron (ZOFRAN-ODT) disintegrating tablet 8 mg (8 mg Oral Given 11/03/17 2110)  FLUoxetine (PROZAC) capsule 20 mg (20 mg Oral Given 11/04/17 2104)  OLANZapine (ZYPREXA) tablet 10 mg (10 mg Oral Given 11/04/17 2104)  nicotine polacrilex (NICORETTE) gum 2 mg (has no administration in time range)  OLANZapine zydis (ZYPREXA) disintegrating tablet 5 mg (5 mg Oral Given 11/02/17 1829)    Or  ziprasidone (GEODON) injection 20 mg ( Intramuscular See Alternative 11/02/17 1829)  LORazepam (ATIVAN) tablet 1 mg (1 mg Oral Given 11/03/17 2109)    Or  LORazepam (ATIVAN) injection 1 mg ( Intramuscular See Alternative 11/03/17 2109)  albuterol (PROVENTIL HFA;VENTOLIN HFA) 108 (90 Base) MCG/ACT inhaler 1-2 puff (2 puffs Inhalation Given 11/04/17 1509)  polyethylene glycol (MIRALAX / GLYCOLAX) packet 17 g (17 g Oral Not Given 11/04/17  0847)  bisacodyl (DULCOLAX) EC tablet 5 mg (5 mg Oral Given 11/01/17 2216)  hydrOXYzine (ATARAX/VISTARIL) tablet 25 mg (25 mg Oral Given 11/04/17 1721)  prazosin (MINIPRESS) capsule 2 mg (2 mg Oral Given 11/04/17 2104)  Influenza vac split quadrivalent PF (FLUARIX) injection 0.5 mL (0.5 mLs Intramuscular Given 10/30/17 1037)     Initial Impression / Assessment and Plan / ED Course  I have reviewed the triage vital signs and the nursing notes.  Pertinent labs & imaging results that were available during my care of the patient were reviewed by me and considered in my medical decision making (see chart for details).   Final Clinical Impressions(s) / ED Diagnoses   Final diagnoses:  Right hand pain   Patient with right hand and wrist injury that occurred while playing basketball earlier today.  X-ray of the right hand and right wrist with no acute bony abnormalities.  Will place patient in wrist splint for comfort and have her follow-up with orthopedics if she is continuing to have pain.  Advised her to return to the ER for any new or worsening symptoms in the meantime.  She voices understanding the plan agrees to return immediately to the ED.  Questions answered. Pt will be transferred back to [facility].   ED Discharge Orders    None       Bishop Dublin 11/04/17 2133    Charlesetta Shanks, MD 11/05/17 [phone]

## 2017-11-04 NOTE — ED Notes (Signed)
Report given to [facility] RN and [facility] called for transport back to [facility]

## 2017-11-04 NOTE — Progress Notes (Signed)
D:  Patient's self inventory sheet, patient sleeps good, no sleep medication.  Good appetite, normal energy level, good concentration .  Denied depression, hopeless and anxiety.  Denied withdrawals.  Denied SI.  Physical problems, headaches, pain medication helpful.  Goal is discharge.  Plans to talk to SW/MD.  Does have discharge plans. A:  Medications administered per MD orders.  Emotional support and encouragement given patient. R:  Denied SI and HI, contracts for safety.  Denied A/V hallucinations.  Safety maintained with 15 minute checks.

## 2017-11-04 NOTE — Plan of Care (Signed)
Nurse discussed anxiety, depression, coping skills with patient. 
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
## 2017-11-04 NOTE — ED Triage Notes (Signed)
Patient arrives with c/o right hand injury after playing basketball. Patient states another player fell on her hand. +Numbness, pulses intact, able to move fingers.

## 2017-11-04 NOTE — Progress Notes (Addendum)
Nurse called patient's mother and told her she went to ER for R hand evaluation.   Mother wanted to know if she was being discharged tomorrow.  Mother thanked nurse for calling her.

## 2017-11-04 NOTE — ED Notes (Signed)
Bed: WTR7 Expected date:  Expected time:  Means of arrival:  Comments: Coming from [facility] hand

## 2017-11-04 NOTE — Progress Notes (Signed)
Nurse called [facility] and [facility] for pickup.  Patient to be transported to [facility] ER for R hand evaluation.

## 2017-11-04 NOTE — Progress Notes (Signed)
MD contacted and requested patient to be sent to ER for evaluation.  Patient was heard in dayroom telling patients and staff that she plans to put on her bucket list that she wants to go to ER.

## 2017-11-05 MED ORDER — HYDROXYZINE HCL 25 MG PO TABS: 25.0000 mg | ORAL_TABLET | Freq: Four times a day (QID) | ORAL | 0 refills | Status: DC | PRN

## 2017-11-05 MED ORDER — PRAZOSIN HCL 2 MG PO CAPS: 2.0000 mg | ORAL_CAPSULE | Freq: Every day | ORAL | 0 refills | Status: DC

## 2017-11-05 MED ORDER — FLUOXETINE HCL 20 MG PO CAPS: 20.0000 mg | ORAL_CAPSULE | Freq: Every day | ORAL | 0 refills | Status: DC

## 2017-11-05 MED ORDER — OLANZAPINE 10 MG PO TABS: 10.0000 mg | ORAL_TABLET | Freq: Every day | ORAL | 0 refills | Status: DC

## 2017-11-05 NOTE — Progress Notes (Signed)
Patient ID: Tina Hale, female   DOB: May 14, 1995, 22 y.o.   MRN: [id number]  Pt. Discharged per MD orders;  PT. Currently denies any HI/SI or AVH.  Pt. Was given education regarding follow up appointments and medications by RN.  Pt. Denies any questions or concerns about the medications.  Pt. Was escorted to the search room to retrieve her belongings by RN before being discharged to the hospital lobby.

## 2017-11-05 NOTE — Progress Notes (Signed)
Patient ID: Tina Hale, female   DOB: [birth year], 22 y.o.   MRN: [id number]  DAR: Patient returned from [facility] hospital last evening at approximately 830pm, and as per report from sending RN, no injuries were found on pt's right hand and right upper extremity.  Pt continued to complain of pain in the area while there, and was given a brace.  Metal insert taken out of brace and placed in med room, and orders obtained from from provider for pt to wear the bracelet without the metal insert during the day.  Pt with good range of motion in this extremity, denies any numbness/tingling sensations to area, +radial pulse, no discoloration to area, and denied pain to this extremity on arrival back to the unit, and currently denies pain as well.  Pt slept through the with no signs of distress.  Maintained on Q15 minute checks for safety through the night. Will report to oncoming shift.

## 2017-11-05 NOTE — Tx Team (Signed)
Interdisciplinary Treatment and Diagnostic Plan Update  11/05/2017 Time of Session: [place] MRN: [id number]  Principal Diagnosis: PTSD (post-traumatic stress disorder)  Secondary Diagnoses: Principal Problem:   PTSD (post-traumatic stress disorder)   Current Medications:  Current Facility-Administered Medications  Medication Dose Route Frequency Provider Last Rate Last Dose  . acetaminophen (TYLENOL) tablet 650 mg  650 mg Oral Q6H PRN Lindon Romp A, NP   650 mg at 11/05/17 1014  . albuterol (PROVENTIL HFA;VENTOLIN HFA) 108 (90 Base) MCG/ACT inhaler 1-2 puff  1-2 puff Inhalation Q4H PRN Lindon Romp A, NP   2 puff at 11/04/17 1509  . bisacodyl (DULCOLAX) EC tablet 5 mg  5 mg Oral Daily PRN Money, Lowry Ram, FNP   5 mg at 11/01/17 2216  . FLUoxetine (PROZAC) capsule 20 mg  20 mg Oral QHS Pennelope Bracken, MD   20 mg at 11/04/17 2104  . hydrOXYzine (ATARAX/VISTARIL) tablet 25 mg  25 mg Oral Q6H PRN Cobos, Myer Peer, MD   25 mg at 11/04/17 1721  . LORazepam (ATIVAN) tablet 1 mg  1 mg Oral Q6H PRN Pennelope Bracken, MD   1 mg at 11/03/17 2109   Or  . LORazepam (ATIVAN) injection 1 mg  1 mg Intramuscular Q6H PRN Pennelope Bracken, MD      . nicotine polacrilex (NICORETTE) gum 2 mg  2 mg Oral Q1H PRN Pennelope Bracken, MD      . OLANZapine (ZYPREXA) tablet 10 mg  10 mg Oral QHS Pennelope Bracken, MD   10 mg at 11/04/17 2104  . OLANZapine zydis (ZYPREXA) disintegrating tablet 5 mg  5 mg Oral Q8H PRN Pennelope Bracken, MD   5 mg at 11/02/17 1829   Or  . ziprasidone (GEODON) injection 20 mg  20 mg Intramuscular Q12H PRN Pennelope Bracken, MD      . ondansetron (ZOFRAN-ODT) disintegrating tablet 8 mg  8 mg Oral Q8H PRN Pennelope Bracken, MD   8 mg at 11/03/17 2110  . polyethylene glycol (MIRALAX / GLYCOLAX) packet 17 g  17 g Oral Daily Money, Lowry Ram, [place]   Stopped at 11/03/17 0831  . prazosin (MINIPRESS) capsule 2 mg  2 mg Oral  QHS Cobos, Myer Peer, MD   2 mg at 11/04/17 2104   PTA Medications: Medications Prior to Admission  Medication Sig Dispense Refill Last Dose  . AMITRIPTYLINE HCL PO Take 1 tablet by mouth daily.   10/28/2017 at Unknown time  . clindamycin (CLEOCIN) 150 MG capsule Take 1 capsule (150 mg total) by mouth every 6 (six) hours. (Patient not taking: Reported on 10/28/2017) 28 capsule 0 Not Taking at Unknown time  . etonogestrel (NEXPLANON) 68 MG IMPL implant 1 each by Subdermal route once.     Marland Kitchen ibuprofen (ADVIL,MOTRIN) 600 MG tablet Take 1 tablet (600 mg total) by mouth every 6 (six) hours as needed. (Patient not taking: Reported on 10/28/2017) 30 tablet 0 Not Taking at Unknown time  . methylphenidate 36 MG PO CR tablet Take 72 mg by mouth daily.   0 10/28/2017 at Unknown time  . PARoxetine (PAXIL) 10 MG tablet Take 10 mg by mouth daily.  2 10/28/2017 at Unknown time  . risperiDONE (RISPERDAL) 0.25 MG tablet Take 0.25 mg by mouth daily.  2 10/28/2017 at Unknown time  . traMADol (ULTRAM) 50 MG tablet Take 1 tablet (50 mg total) by mouth every 6 (six) hours as needed. (Patient not taking: Reported on 10/28/2017) 15 tablet  0 Not Taking at Unknown time  . traZODone (DESYREL) 100 MG tablet Take 100 mg by mouth at bedtime.   unknown    Patient Stressors: Marital or family conflict Occupational concerns  Patient Strengths: Ability for insight Average or above average intelligence Capable of independent living [employer] of knowledge Motivation for treatment/growth  Treatment Modalities: Medication Management, Group therapy, Case management,  1 to 1 session with clinician, Psychoeducation, Recreational therapy.   Physician Treatment Plan for Primary Diagnosis: PTSD (post-traumatic stress disorder) Long Term Goal(s): Improvement in symptoms so as ready for discharge Improvement in symptoms so as ready for discharge   Short Term Goals: Ability to identify and develop effective coping behaviors  will improve Ability to demonstrate self-control will improve  Medication Management: Evaluate patient's response, side effects, and tolerance of medication regimen.  Therapeutic Interventions: 1 to 1 sessions, Unit Group sessions and Medication administration.  Evaluation of Outcomes: Adequate for Discharge  Physician Treatment Plan for Secondary Diagnosis: Principal Problem:   PTSD (post-traumatic stress disorder)  Long Term Goal(s): Improvement in symptoms so as ready for discharge Improvement in symptoms so as ready for discharge   Short Term Goals: Ability to identify and develop effective coping behaviors will improve Ability to demonstrate self-control will improve     Medication Management: Evaluate patient's response, side effects, and tolerance of medication regimen.  Therapeutic Interventions: 1 to 1 sessions, Unit Group sessions and Medication administration.  Evaluation of Outcomes: Adequate for Discharge   RN Treatment Plan for Primary Diagnosis: PTSD (post-traumatic stress disorder) Long Term Goal(s): Knowledge of disease and therapeutic regimen to maintain health will improve  Short Term Goals: Ability to identify and develop effective coping behaviors will improve and Compliance with prescribed medications will improve  Medication Management: RN will administer medications as ordered by provider, will assess and evaluate patient's response and provide education to patient for prescribed medication. RN will report any adverse and/or side effects to prescribing provider.  Therapeutic Interventions: 1 on 1 counseling sessions, Psychoeducation, Medication administration, Evaluate responses to treatment, Monitor vital signs and CBGs as ordered, Perform/monitor CIWA, COWS, AIMS and Fall Risk screenings as ordered, Perform wound care treatments as ordered.  Evaluation of Outcomes: Adequate for Discharge   LCSW Treatment Plan for Primary Diagnosis: PTSD (post-traumatic  stress disorder) Long Term Goal(s): Safe transition to appropriate next level of care at discharge, Engage patient in therapeutic group addressing interpersonal concerns.  Short Term Goals: Engage patient in aftercare planning with referrals and resources, Increase social support and Increase skills for wellness and recovery  Therapeutic Interventions: Assess for all discharge needs, 1 to 1 time with Social worker, Explore available resources and support systems, Assess for adequacy in community support network, Educate family and significant other(s) on suicide prevention, Complete Psychosocial Assessment, Interpersonal group therapy.  Evaluation of Outcomes: Adequate for Discharge   Progress in Treatment: Attending groups: Yes. Participating in groups: Yes. Taking medication as prescribed: Yes. Toleration medication: Yes. Family/Significant other contact made: Yes, individual(s) contacted:  the patient's mother Patient understands diagnosis: Yes. Discussing patient identified problems/goals with staff: Yes. Medical problems stabilized or resolved: Yes. Denies suicidal/homicidal ideation: Yes. Issues/concerns per patient self-inventory: No. Other: none  New problem(s) identified: No, Describe:  none  New Short Term/Long Term Goal(s): medication stabilization, elimination of SI thoughts, development of comprehensive mental wellness plan.    Patient Goals:  "to not hurt anybody, calm down when I'm irritated"  Discharge Plan or Barriers: Patient is returning home and following up with  [facility] for medication management and [facility] for therapy services.   Reason for Continuation of Hospitalization: None   Estimated Length of Stay: 11/05/2017  Attendees: Patient:Tina Hale 10/31/2017   Physician: Dr. Parke Poisson, MD; Dr. Myles Lipps, MD 10/31/2017   Nursing: Baldo Daub, RN; Danae Chen.Loletha Grayer, RN 10/31/2017  RN Care Manager:X 10/31/2017   Social Worker: Lurline Idol, LCSW;  Radonna Ricker, [place] 10/31/2017   Recreational Therapist: Rhunette Croft 10/31/2017   Other: Marvia Pickles, NP 10/31/2017   Other:  10/31/2017   Other: 10/31/2017        Scribe for Treatment Team: Marylee Floras, [place] 11/05/2017 11:36 AM

## 2017-11-05 NOTE — [facility] Suicide Risk Assessment (Signed)
[facility] Discharge Suicide Risk Assessment   Principal Problem: PTSD (post-traumatic stress disorder) Discharge Diagnoses:  Patient Active Problem List   Diagnosis Date Noted  . PTSD (post-traumatic stress disorder) [F43.10] 10/29/2017  . Nexplanon removal [Z30.46] 08/01/2015  . Nexplanon insertion [R44.315] 08/01/2015  . 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 Migraine without aura and without status migrainosus, not intractable [G43.009] 06/01/2014  . Gait disorder [R26.9] 06/01/2014  . Persistent vomiting [R11.15] 05/04/2014  . Left hip pain [M25.552] 05/04/2014  . Closed head injury with brief loss of consciousness ([place]) [Q00.8Q7Y] 05/04/2014  . New daily persistent headache [G44.52] 03/29/2014  . Neck pain, bilateral [M54.2] 03/29/2014  . Stiff neck [M43.6] 03/29/2014  . Monocular diplopia [H53.2] 03/29/2014  . Left sided numbness [R20.0] 03/29/2014  . Headaches [R51] 03/23/2014  . Family circumstance [Z63.9] 11/24/2013  . Failed vision screen [Z01.01] 11/24/2013  . Obesity [E66.9] 11/24/2013  . Depression [F32.9] 11/24/2013  . Attention deficit hyperactivity disorder [F90.9] 11/24/2013  . Suicidal ideation [R45.851] 11/22/2012  . Homicidal ideation [R45.850] 11/22/2012  . Depression with anxiety [F41.8] 11/22/2012  . Obsessive compulsive disorder [F42.9] 11/22/2012  . 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 Parent-child relational problem [Z62.820] 11/22/2012    Total Time spent with patient: 15 minutes  Musculoskeletal: Strength & Muscle Tone: within normal limits Gait & Station: normal Patient leans: N/A  Psychiatric Specialty Exam: Review of Systems  Musculoskeletal: Positive for joint pain.  All other systems reviewed and are negative.   Blood pressure 105/66, pulse (!) 120, temperature 98 F (36.7 C), temperature source Oral, resp. rate 16, height 5\' 5"  (1.651 m), weight  106.1 kg, SpO2 100 %.Body mass index is 38.92 kg/m.  General Appearance: Casual  Eye Contact::  Fair  Speech:  Normal Rate409  Volume:  Normal  Mood:  Anxious  Affect:  Congruent  Thought Process:  Coherent and Descriptions of Associations: Intact  Orientation:  Full (Time, Place, and Person)  Thought Content:  Logical  Suicidal Thoughts:  No  Homicidal Thoughts:  No  Memory:  Immediate;   Fair Recent;   Fair Remote;   Fair  Judgement:  Intact  Insight:  Fair  Psychomotor Activity:  Normal  Concentration:  Fair  Recall:  [employer] of Knowledge:Fair  Language: Good  Akathisia:  Negative  Handed:  Right  AIMS (if indicated):     Assets:  Desire for Improvement Housing Physical Health Resilience Social Support  Sleep:  Number of Hours: 6.75  Cognition: WNL  ADL's:  Intact   Mental Status Per Nursing Assessment::   On Admission:  Suicidal ideation indicated by patient, Self-harm thoughts, Self-harm behaviors  Demographic Factors:  Adolescent or young adult  Loss Factors: NA  Historical Factors: Impulsivity  Risk Reduction Factors:   Employed, Living with another person, especially a relative and Positive social support  Continued Clinical Symptoms:  Depression:   Impulsivity  Cognitive Features That Contribute To Risk:  None    Suicide Risk:  Minimal: No identifiable suicidal ideation.  Patients presenting with no risk factors but with morbid ruminations; may be classified as minimal risk based on the severity of the depressive symptoms  Follow-up Information    [facility]. Go on 11/12/2017.   Why:  Appointment for medication management is Wednesday, 11/12/2017 at 2:00pm. Please be sure to bring your Photo ID, any insurance cards, and any discharge paperwork from this hospitalization.  Contact information: [place],  [place], [place] [address]  [phone] Fax: [phone]       [facility] Follow up.   Contact  information: [place] [address] [phone]        Schedule an appointment as soon as possible for a visit  with Marybelle Killings, MD.   Specialty:  Orthopedic Surgery Why:  For recheck in 7-10 days if you continue to have right hand pain you may need repeat xrays to rule out a fracture. Contact information: [place] [address] [phone]           Plan Of Care/Follow-up recommendations:  Activity:  ad lib  Sharma Covert, MD 11/05/2017, 7:43 AM

## 2017-11-05 NOTE — Progress Notes (Signed)
Recreation Therapy Notes  Date: 10.23.19 Time: 0930 Location: [address] Dayroom  Group Topic: Stress Management  Goal Area(s) Addresses:  Patient will verbalize importance of using healthy stress management.  Patient will identify positive emotions associated with healthy stress management.   Intervention: Stress Management  Activity :  Guided Imagery.  LRT introduced the stress management technique of guided imagery.  LRT read a script to allow patients to envision their peaceful place.  Patients were to follow along as script was read to engage in the activity.  Education:  Stress Management, Discharge Planning.   Education Outcome: Acknowledges edcuation/In group clarification offered/Needs additional education  Clinical Observations/Feedback: Pt did not attend group.      Victorino Sparrow, LRT/CTRS         Ria Comment, Lucas Exline A 11/05/2017 11:25 AM
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
## 2017-11-05 NOTE — Progress Notes (Signed)
  [facility] Adult Case Management Discharge Plan :  Will you be returning to the same living situation after discharge:  Yes,  patient reports she is returning home with her mother At discharge, do you have transportation home?: Yes,  patient's mother is picking the patient up at discharge Do you have the ability to pay for your medications: Yes,  BCBS, support from parent  Release of information consent forms completed and in the chart;  Patient's signature needed at discharge.  Patient to Follow up at: Follow-up Information    [place]. Go on 11/12/2017.   Why:  Appointment for medication management is Wednesday, 11/12/2017 at 2:00pm. Please be sure to bring your Photo ID, any insurance cards, and any discharge paperwork from this hospitalization.  Contact information: [address] [place], [place] [address]  Phone:([phone] Fax: [phone]       [facility] Follow up.   Contact information: [place] [address] [phone]        Schedule an appointment as soon as possible for a visit  with Marybelle Killings, MD.   Specialty:  Orthopedic Surgery Why:  For recheck in 7-10 days if you continue to have right hand pain you may need repeat xrays to rule out a fracture. Contact information: [place] [address] [phone]           Next level of care provider has access to [place] and Suicide Prevention discussed: Yes,  with the patient's mother  Have you used any form of tobacco in the last 30 days? (Cigarettes, Smokeless Tobacco, Cigars, and/or Pipes): No  Has patient been referred to the Quitline?: N/A patient is not a smoker  Patient has been referred for addiction treatment: N/A  Marylee Floras, [place] 11/05/2017, 11:05 AM

## 2017-12-08 ENCOUNTER — Emergency Department (HOSPITAL_COMMUNITY)
Admission: EM | Admit: 2017-12-08 | Discharge: 2017-12-09 | Disposition: A | Discharge status: Home / Self Care | Payer: BLUE CROSS/BLUE SHIELD | Attending: Emergency Medicine | Admitting: Emergency Medicine

## 2017-12-08 ENCOUNTER — Emergency Department (HOSPITAL_COMMUNITY): Payer: BLUE CROSS/BLUE SHIELD

## 2017-12-08 ENCOUNTER — Encounter (HOSPITAL_COMMUNITY): Payer: Self-pay

## 2017-12-08 DIAGNOSIS — H9202 Otalgia, left ear: Secondary | ICD-10-CM | POA: Insufficient documentation

## 2017-12-08 DIAGNOSIS — J45909 Unspecified asthma, uncomplicated: Secondary | ICD-10-CM | POA: Diagnosis not present

## 2017-12-08 DIAGNOSIS — R1013 Epigastric pain: Secondary | ICD-10-CM | POA: Insufficient documentation

## 2017-12-08 DIAGNOSIS — R05 Cough: Secondary | ICD-10-CM | POA: Diagnosis not present

## 2017-12-08 DIAGNOSIS — Z7722 Contact with and (suspected) exposure to environmental tobacco smoke (acute) (chronic): Secondary | ICD-10-CM | POA: Diagnosis not present

## 2017-12-08 DIAGNOSIS — J029 Acute pharyngitis, unspecified: Secondary | ICD-10-CM | POA: Diagnosis not present

## 2017-12-08 DIAGNOSIS — Z79899 Other long term (current) drug therapy: Secondary | ICD-10-CM | POA: Insufficient documentation

## 2017-12-08 DIAGNOSIS — H6123 Impacted cerumen, bilateral: Secondary | ICD-10-CM | POA: Diagnosis not present

## 2017-12-08 DIAGNOSIS — R112 Nausea with vomiting, unspecified: Secondary | ICD-10-CM | POA: Diagnosis present

## 2017-12-08 DIAGNOSIS — M791 Myalgia, unspecified site: Secondary | ICD-10-CM | POA: Diagnosis not present

## 2017-12-08 LAB — CBC WITH DIFFERENTIAL/PLATELET
Abs Immature Granulocytes: 0.01 10*3/uL (ref 0.00–0.07)
Basophils Absolute: 0 10*3/uL (ref 0.0–0.1)
Basophils Relative: 0 %
EOS PCT: 1 %
Eosinophils Absolute: 0 10*3/uL (ref 0.0–0.5)
HCT: 42.1 % (ref 36.0–46.0)
HEMOGLOBIN: 12.2 g/dL (ref 12.0–15.0)
Immature Granulocytes: 0 %
LYMPHS PCT: 40 %
Lymphs Abs: 1.9 10*3/uL (ref 0.7–4.0)
MCH: 27.7 pg (ref 26.0–34.0)
MCHC: 29 g/dL — AB (ref 30.0–36.0)
MCV: 95.5 fL (ref 80.0–100.0)
MONO ABS: 0.4 10*3/uL (ref 0.1–1.0)
MONOS PCT: 7 %
Neutro Abs: 2.5 10*3/uL (ref 1.7–7.7)
Neutrophils Relative %: 52 %
Platelets: 162 10*3/uL (ref 150–400)
RBC: 4.41 MIL/uL (ref 3.87–5.11)
RDW: 13.2 % (ref 11.5–15.5)
WBC: 4.8 10*3/uL (ref 4.0–10.5)
nRBC: 0 % (ref 0.0–0.2)

## 2017-12-08 LAB — COMPREHENSIVE METABOLIC PANEL
ALT: 16 U/L (ref 0–44)
AST: 25 U/L (ref 15–41)
Albumin: 3.8 g/dL (ref 3.5–5.0)
Alkaline Phosphatase: 61 U/L (ref 38–126)
Anion gap: 9 (ref 5–15)
BILIRUBIN TOTAL: 0.6 mg/dL (ref 0.3–1.2)
BUN: 8 mg/dL (ref 6–20)
CHLORIDE: 105 mmol/L (ref 98–111)
CO2: 22 mmol/L (ref 22–32)
CREATININE: 0.58 mg/dL (ref 0.44–1.00)
Calcium: 9 mg/dL (ref 8.9–10.3)
GFR calc Af Amer: >60 mL/min (ref >60)
Glucose, Bld: 73 mg/dL (ref 70–99)
Potassium: 4.2 mmol/L (ref 3.5–5.1)
Sodium: 136 mmol/L (ref 135–145)
Total Protein: 7.8 g/dL (ref 6.5–8.1)

## 2017-12-08 LAB — I-STAT BETA HCG BLOOD, ED (MC, WL, AP ONLY)

## 2017-12-08 LAB — GROUP A STREP BY PCR: Group A Strep by PCR: NOT DETECTED

## 2017-12-08 LAB — LIPASE, BLOOD: Lipase: 38 U/L (ref 11–51)

## 2017-12-08 MED ORDER — DICYCLOMINE HCL 10 MG/ML IM SOLN
20.0000 mg | Freq: Once | INTRAMUSCULAR | Status: AC
  Administered 2017-12-08: 20 mg via INTRAMUSCULAR
  Filled 2017-12-08: qty 2

## 2017-12-08 MED ORDER — DOCUSATE SODIUM 50 MG/5ML PO LIQD
50.0000 mg | Freq: Once | ORAL | Status: AC
  Administered 2017-12-09: 50 mg via ORAL
  Filled 2017-12-08: qty 10

## 2017-12-08 MED ORDER — ONDANSETRON 4 MG PO TBDP
4.0000 mg | ORAL_TABLET | Freq: Once | ORAL | Status: AC
  Administered 2017-12-08: 4 mg via ORAL
  Filled 2017-12-08: qty 1

## 2017-12-08 NOTE — ED Provider Notes (Addendum)
[facility] Provider Note   CSN: [id number] Arrival date & time: 12/08/17  2002     History   Chief Complaint Chief Complaint  Patient presents with  . Abdominal Pain  . Ear Pain  . Emesis    HPI Tina Hale is a 22 y.o. female with history as below presents emergency department today for 3-4 days of left ear pain, sore throat, myalgias, epigastric abdominal pain, nausea and vomiting.  Patient reports that she has been taking care of 7-8 children, babysitting over the last 1 week.  She reports her last 3 to 4 days she began having left ear fullness, sore throat with dysphasia, myalgias, nonproductive cough, epigastric abdominal pain as well as nausea and nonbilious, nonbloody emesis.  Patient reports she has had a decreased appetite because of this.  Her abdominal pain is constant.  Is not colicky or related to food.  She denies any right upper quadrant abdominal pain.  No migration of her pain to the lower abdomen.  She denies any associated urinary symptoms.  Patient does not take anything for symptoms.  She denies any fever, chills, shortness of breath, flank pain, urinary symptoms, diarrhea.  She reports normal bowel movement earlier today and also last night.  She is still passing gas.  No previous abdominal surgeries.  She is unable to differentiate abdominal pain versus substernal chest pain.  She notes this is mild in nature and not positional, pleuritic or exertional in nature. Denies risk factors for DVT/PE including oral exogenous estrogen use, recent surgery or travel, trauma, immobilization, previous blood clot, hemoptysis, cancer, lower extremity pain or swelling, or family history of bleeding/clotting disorder.   HPI  Past Medical History:  Diagnosis Date  . ADD (attention deficit disorder)   . ADHD (attention deficit hyperactivity disorder)   . Anemia   . Anger   . Anxiety   . Asthma   . Depression   . Head trauma   . Headache     . Memory loss   . PTSD (post-traumatic stress disorder)     Patient Active Problem List   Diagnosis Date Noted  . PTSD (post-traumatic stress disorder) 10/29/2017  . Nexplanon removal 08/01/2015  . Nexplanon insertion 08/01/2015  . 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 Migraine without aura and without status migrainosus, not intractable 06/01/2014  . Gait disorder 06/01/2014  . Persistent vomiting 05/04/2014  . Left hip pain 05/04/2014  . Closed head injury with brief loss of consciousness ([place]) 05/04/2014  . New daily persistent headache 03/29/2014  . Neck pain, bilateral 03/29/2014  . Stiff neck 03/29/2014  . Monocular diplopia 03/29/2014  . Left sided numbness 03/29/2014  . Headaches 03/23/2014  . Family circumstance 11/24/2013  . Failed vision screen 11/24/2013  . Obesity 11/24/2013  . Depression 11/24/2013  . Attention deficit hyperactivity disorder 11/24/2013  . Suicidal ideation 11/22/2012  . Homicidal ideation 11/22/2012  . Depression with anxiety 11/22/2012  . Obsessive compulsive disorder 11/22/2012  . 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 Parent-child relational problem 11/22/2012    History reviewed. No pertinent surgical history.   OB History    Gravida  0   Para  0   Term  0   Preterm  0   AB  0   Living        SAB  0   TAB  0   Ectopic  0   Multiple  Live Births               Home Medications    Prior to Admission medications   Medication Sig Start Date End Date Taking? Authorizing Provider  FLUoxetine (PROZAC) 20 MG capsule Take 1 capsule (20 mg total) by mouth at bedtime. For mood control 11/05/17   Money, Lowry Ram, FNP  hydrOXYzine (ATARAX/VISTARIL) 25 MG tablet Take 1 tablet (25 mg total) by mouth every 6 (six) hours as needed for anxiety. 11/05/17   Money, Lowry Ram, FNP  OLANZapine (ZYPREXA) 10 MG tablet Take 1 tablet (10 mg total) by mouth at  bedtime. for mood control 11/05/17   Money, Darnelle Maffucci B, FNP  prazosin (MINIPRESS) 2 MG capsule Take 1 capsule (2 mg total) by mouth at bedtime. For nightmares 11/05/17   Money, Lowry Ram, FNP    Family History Family History  Problem Relation Age of Onset  . High blood pressure Mother   . High blood pressure Father   . Cancer Maternal Grandmother     Social History Social History   Tobacco Use  . Smoking status: Passive Smoke Exposure - Never Smoker  . Smokeless tobacco: Never Used  . Tobacco comment: Step dad smokes  Substance Use Topics  . Alcohol use: No    Alcohol/week: 0.0 standard drinks    Frequency: Never  . Drug use: No     Allergies   Amoxicillin; Ethyl alcohol (skin cleanser); Other; and Tomato   Review of Systems Review of Systems  All other systems reviewed and are negative.    Physical Exam Updated Vital Signs BP 105/69   Pulse 77   Temp 98.2 F (36.8 C) (Oral)   Resp 18   Ht 5\' 6"  (1.676 m)   Wt 108.9 kg   SpO2 100%   BMI 38.74 kg/m   Physical Exam  Constitutional: She appears well-developed and well-nourished.  HENT:  Head: Normocephalic and atraumatic.  Right Ear: External ear normal. No mastoid tenderness.  Left Ear: External ear normal. No mastoid tenderness.  Nose: Mucosal edema present. Right sinus exhibits no maxillary sinus tenderness and no frontal sinus tenderness. Left sinus exhibits no maxillary sinus tenderness and no frontal sinus tenderness.  Mouth/Throat: Uvula is midline, oropharynx is clear and moist and mucous membranes are normal. No tonsillar exudate.  Cerumen impaction b/l. The patient has normal phonation and is in control of secretions. No stridor.  Midline uvula without edema. Soft palate rises symmetrically.  No tonsillar erythema or exudates. No PTA. Tongue protrusion is normal. No trismus. No creptius on neck palpation and patient has good dentition. No gingival erythema or fluctuance noted. Mucus membranes moist.     Eyes: Pupils are equal, round, and reactive to light. Right eye exhibits no discharge. Left eye exhibits no discharge. No scleral icterus.  Neck: Trachea normal. Neck supple. No spinous process tenderness present. No neck rigidity. Normal range of motion present.  No nuchal rigidity or meningismus  Cardiovascular: Normal rate, regular rhythm and intact distal pulses.  No murmur heard. Pulses:      Radial pulses are 2+ on the right side, and 2+ on the left side.       Dorsalis pedis pulses are 2+ on the right side, and 2+ on the left side.       Posterior tibial pulses are 2+ on the right side, and 2+ on the left side.  No lower extremity swelling or edema. Calves symmetric in size bilaterally.  Pulmonary/Chest: Effort normal and  breath sounds normal. She exhibits no tenderness.  Abdominal: Soft. Bowel sounds are normal. She exhibits no distension. There is no tenderness. There is no rigidity, no rebound, no guarding, no CVA tenderness, no tenderness at McBurney's point and negative Murphy's sign.  Musculoskeletal: She exhibits no edema.  Lymphadenopathy:    She has no cervical adenopathy.  Neurological: She is alert.  Skin: Skin is warm and dry. No rash noted. She is not diaphoretic.  Psychiatric: She has a normal mood and affect.  Nursing note and vitals reviewed.    ED Treatments / Results  Labs (all labs ordered are listed, but only abnormal results are displayed) Labs Reviewed  CBC WITH DIFFERENTIAL/PLATELET - Abnormal; Notable for the following components:      Result Value   MCHC 29.0 (*)    All other components within normal limits  GROUP A STREP BY PCR  COMPREHENSIVE METABOLIC PANEL  LIPASE, BLOOD  URINALYSIS, ROUTINE W REFLEX MICROSCOPIC  I-STAT BETA HCG BLOOD, ED ([facility], [facility], [facility] ONLY)    EKG None  Radiology Dg Chest 2 View  Result Date: 12/08/2017 CLINICAL DATA:  Chest pain.  Nausea and vomiting. EXAM: CHEST - 2 VIEW COMPARISON:  Two-view chest x-ray 10/10/2016  FINDINGS: The heart size and mediastinal contours are within normal limits. Both lungs are clear. The visualized skeletal structures are unremarkable. IMPRESSION: Negative two view chest x-ray Electronically Signed   By: San Morelle M.D.   On: 12/08/2017 21:49   Dg Abdomen 1 View  Result Date: 12/08/2017 CLINICAL DATA:  Nausea and vomiting.  Chest pain. EXAM: ABDOMEN - 1 VIEW COMPARISON:  Acute abdominal series 10/11/2016 FINDINGS: The bowel gas pattern is normal. No radio-opaque calculi or other significant radiographic abnormality are seen. IMPRESSION: Negative abdomen radiographs. Electronically Signed   By: San Morelle M.D.   On: 12/08/2017 21:51    Procedures Procedures (including critical care time)  Medications Ordered in ED Medications  ondansetron (ZOFRAN-ODT) disintegrating tablet 4 mg (4 mg Oral Given 12/08/17 2046)  dicyclomine (BENTYL) injection 20 mg (20 mg Intramuscular Given 12/08/17 2046)  docusate (COLACE) 50 MG/5ML liquid 50 mg (50 mg Oral Given 12/09/17 0010)     Initial Impression / Assessment and Plan / ED Course  I have reviewed the triage vital signs and the nursing notes.  Pertinent labs & imaging results that were available during my care of the patient were reviewed by me and considered in my medical decision making (see chart for details).     22 year old female with history of 3-4 days of left ear pain, sore throat, myalgias, epigastric abdominal pain, nausea and vomiting.  Patient reports symptoms onset after babysitting 7-8 children over the last 1 week that were sick with similar symptoms.  Patient's vitals reassuring on presentation.  He exam reassuring as above.  No evidence of mastoiditis or meningitis.  No evidence of PTA or RPA.  Strep test negative.  Lungs are clear to auscultation bilaterally.  Chest x-ray unremarkable.  Abdomen is soft and nontender.  Screening x-ray without evidence of obstruction.  Do not suspect intra-abdominal  pathology.  Lab work reviewed and unremarkable.  Cerumen impactions disimpacted by nursing staff.  Patient outside window for tamiflu if this was the flu. Will treat conservatively.  No further work-up indicated.  Return precautions discussed.  Patient appears safe for discharge.  Final Clinical Impressions(s) / ED Diagnoses   Final diagnoses:  Nausea and vomiting in adult    ED Discharge Orders  Ordered    dicyclomine (BENTYL) 20 MG tablet  2 times daily     12/09/17 0027    ibuprofen (ADVIL,MOTRIN) 600 MG tablet  Every 6 hours PRN     12/09/17 0027    ondansetron (ZOFRAN) 4 MG tablet  Every 6 hours     12/09/17 0027           Jillyn Ledger, PA-C 12/09/17 0038    Tylek Boney, Barth Kirks, PA-C 12/09/17 7949    Margette Fast, MD 12/09/17 [phone]

## 2017-12-08 NOTE — ED Triage Notes (Signed)
Pt. Coming from home via EMS with reports of flu like symptoms for 3 days. Pt. Reports nausea and vomiting. Pt. Also states she is having substernal chest pain that worsens when she breathes.

## 2017-12-09 MED ORDER — ONDANSETRON HCL 4 MG PO TABS: 4.0000 mg | ORAL_TABLET | Freq: Four times a day (QID) | ORAL | 0 refills | Status: DC

## 2017-12-09 MED ORDER — IBUPROFEN 600 MG PO TABS: 600.0000 mg | ORAL_TABLET | Freq: Four times a day (QID) | ORAL | 0 refills | Status: DC | PRN

## 2017-12-09 MED ORDER — DICYCLOMINE HCL 20 MG PO TABS: 20.0000 mg | ORAL_TABLET | Freq: Two times a day (BID) | ORAL | 0 refills | Status: DC

## 2017-12-09 NOTE — Discharge Instructions (Signed)
Follow attached handout. Take medications as prescribed. If you develop worsening or new concerning symptoms you can return to the emergency department for re-evaluation.

## 2017-12-09 NOTE — ED Notes (Signed)
Ears flushed, moderate amount of wax removed. Pt tolerated well.

## 2017-12-25 ENCOUNTER — Emergency Department (HOSPITAL_COMMUNITY)
Admission: EM | Admit: 2017-12-25 | Discharge: 2017-12-26 | Disposition: A | Discharge status: Home / Self Care | Payer: BLUE CROSS/BLUE SHIELD | Attending: Emergency Medicine | Admitting: Emergency Medicine

## 2017-12-25 ENCOUNTER — Encounter (HOSPITAL_COMMUNITY): Payer: Self-pay | Admitting: Emergency Medicine

## 2017-12-25 ENCOUNTER — Emergency Department (HOSPITAL_COMMUNITY): Payer: BLUE CROSS/BLUE SHIELD

## 2017-12-25 DIAGNOSIS — Z79899 Other long term (current) drug therapy: Secondary | ICD-10-CM | POA: Diagnosis not present

## 2017-12-25 DIAGNOSIS — Z7722 Contact with and (suspected) exposure to environmental tobacco smoke (acute) (chronic): Secondary | ICD-10-CM | POA: Diagnosis not present

## 2017-12-25 DIAGNOSIS — J45909 Unspecified asthma, uncomplicated: Secondary | ICD-10-CM | POA: Insufficient documentation

## 2017-12-25 DIAGNOSIS — R1013 Epigastric pain: Secondary | ICD-10-CM | POA: Diagnosis present

## 2017-12-25 LAB — CBC
HCT: 39.9 % (ref 36.0–46.0)
HEMOGLOBIN: 12.7 g/dL (ref 12.0–15.0)
MCH: 29.1 pg (ref 26.0–34.0)
MCHC: 31.8 g/dL (ref 30.0–36.0)
MCV: 91.3 fL (ref 80.0–100.0)
PLATELETS: 271 10*3/uL (ref 150–400)
RBC: 4.37 MIL/uL (ref 3.87–5.11)
RDW: 13.5 % (ref 11.5–15.5)
WBC: 5.2 10*3/uL (ref 4.0–10.5)
nRBC: 0 % (ref 0.0–0.2)

## 2017-12-25 LAB — COMPREHENSIVE METABOLIC PANEL
ALBUMIN: 4.1 g/dL (ref 3.5–5.0)
ALT: 16 U/L (ref 0–44)
AST: 17 U/L (ref 15–41)
Alkaline Phosphatase: 58 U/L (ref 38–126)
Anion gap: 8 (ref 5–15)
BUN: 12 mg/dL (ref 6–20)
CALCIUM: 9 mg/dL (ref 8.9–10.3)
CHLORIDE: 105 mmol/L (ref 98–111)
CO2: 24 mmol/L (ref 22–32)
Creatinine, Ser: 0.72 mg/dL (ref 0.44–1.00)
Glucose, Bld: 77 mg/dL (ref 70–99)
Potassium: 3.7 mmol/L (ref 3.5–5.1)
SODIUM: 137 mmol/L (ref 135–145)
Total Bilirubin: 0.4 mg/dL (ref 0.3–1.2)
Total Protein: 8.3 g/dL — ABNORMAL HIGH (ref 6.5–8.1)

## 2017-12-25 LAB — URINALYSIS, ROUTINE W REFLEX MICROSCOPIC
Bilirubin Urine: NEGATIVE
Glucose, UA: NEGATIVE mg/dL
Hgb urine dipstick: NEGATIVE
Ketones, ur: NEGATIVE mg/dL
Nitrite: NEGATIVE
PROTEIN: NEGATIVE mg/dL
Specific Gravity, Urine: 1.027 (ref 1.005–1.030)
pH: 6 (ref 5.0–8.0)

## 2017-12-25 LAB — I-STAT BETA HCG BLOOD, ED (MC, WL, AP ONLY)

## 2017-12-25 LAB — LIPASE, BLOOD: LIPASE: 31 U/L (ref 11–51)

## 2017-12-25 MED ORDER — DICYCLOMINE HCL 10 MG/5ML PO SOLN
10.0000 mg | Freq: Once | ORAL | Status: AC
  Administered 2017-12-25: 10 mg via ORAL
  Filled 2017-12-25: qty 5

## 2017-12-25 MED ORDER — LIDOCAINE VISCOUS HCL 2 % MT SOLN
15.0000 mL | Freq: Once | OROMUCOSAL | Status: AC
  Administered 2017-12-25: 15 mL via ORAL
  Filled 2017-12-25: qty 15

## 2017-12-25 MED ORDER — ALUM & MAG HYDROXIDE-SIMETH 200-200-20 MG/5ML PO SUSP
30.0000 mL | Freq: Once | ORAL | Status: AC
  Administered 2017-12-25: 30 mL via ORAL
  Filled 2017-12-25: qty 30

## 2017-12-25 MED ORDER — FAMOTIDINE 20 MG PO TABS: 20.0000 mg | ORAL_TABLET | Freq: Two times a day (BID) | ORAL | 0 refills | Status: DC

## 2017-12-25 NOTE — ED Provider Notes (Signed)
[facility] DEPT Provider Note   CSN: [id number] Arrival date & time: 12/25/17  1812     History   Chief Complaint Chief Complaint  Patient presents with  . Abdominal Pain    HPI Tina Hale is a 22 y.o. female.  Patient presents to the ED with pain in the epigastric area, onset yesterday. Associated nausea and vomiting. No documented fever or chills.   The history is provided by the patient. No language interpreter was used.  Abdominal Pain   This is a new problem. The current episode started yesterday. The problem has been gradually worsening. The pain is associated with an unknown factor. The pain is located in the epigastric region. The pain is moderate. Associated symptoms include nausea and vomiting.    Past Medical History:  Diagnosis Date  . ADD (attention deficit disorder)   . ADHD (attention deficit hyperactivity disorder)   . Anemia   . Anger   . Anxiety   . Asthma   . Depression   . Head trauma   . Headache   . Memory loss   . PTSD (post-traumatic stress disorder)     Patient Active Problem List   Diagnosis Date Noted  . PTSD (post-traumatic stress disorder) 10/29/2017  . Nexplanon removal 08/01/2015  . Nexplanon insertion 08/01/2015  . 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 Migraine without aura and without status migrainosus, not intractable 06/01/2014  . Gait disorder 06/01/2014  . Persistent vomiting 05/04/2014  . Left hip pain 05/04/2014  . Closed head injury with brief loss of consciousness ([place]) 05/04/2014  . New daily persistent headache 03/29/2014  . Neck pain, bilateral 03/29/2014  . Stiff neck 03/29/2014  . Monocular diplopia 03/29/2014  . Left sided numbness 03/29/2014  . Headaches 03/23/2014  . Family circumstance 11/24/2013  . Failed vision screen 11/24/2013  . Obesity 11/24/2013  . Depression 11/24/2013  . Attention deficit hyperactivity disorder 11/24/2013  .  Suicidal ideation 11/22/2012  . Homicidal ideation 11/22/2012  . Depression with anxiety 11/22/2012  . Obsessive compulsive disorder 11/22/2012  . 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 Parent-child relational problem 11/22/2012    History reviewed. No pertinent surgical history.   OB History    Gravida  0   Para  0   Term  0   Preterm  0   AB  0   Living        SAB  0   TAB  0   Ectopic  0   Multiple      Live Births               Home Medications    Prior to Admission medications   Medication Sig Start Date End Date Taking? Authorizing Provider  dicyclomine (BENTYL) 20 MG tablet Take 1 tablet (20 mg total) by mouth 2 (two) times daily. 12/09/17   Muthersbaugh, Jarrett Soho, PA-C  FLUoxetine (PROZAC) 20 MG capsule Take 1 capsule (20 mg total) by mouth at bedtime. For mood control 11/05/17   Money, Lowry Ram, FNP  hydrOXYzine (ATARAX/VISTARIL) 25 MG tablet Take 1 tablet (25 mg total) by mouth every 6 (six) hours as needed for anxiety. 11/05/17   Money, Lowry Ram, FNP  ibuprofen (ADVIL,MOTRIN) 600 MG tablet Take 1 tablet (600 mg total) by mouth every 6 (six) hours as needed. 12/09/17   Muthersbaugh, Jarrett Soho, PA-C  metFORMIN (GLUCOPHAGE) 500 MG tablet Take 500 mg by mouth 2 (two) times  daily. 11/17/17   [provider]  OLANZapine (ZYPREXA) 10 MG tablet Take 1 tablet (10 mg total) by mouth at bedtime. for mood control Patient not taking: Reported on 12/08/2017 11/05/17   Money, Lowry Ram, FNP  ondansetron (ZOFRAN) 4 MG tablet Take 1 tablet (4 mg total) by mouth every 6 (six) hours. 12/09/17   Muthersbaugh, Jarrett Soho, PA-C  prazosin (MINIPRESS) 2 MG capsule Take 1 capsule (2 mg total) by mouth at bedtime. For nightmares 11/05/17   Money, Lowry Ram, FNP  VYVANSE 40 MG capsule Take 40 mg by mouth daily. 11/24/17   [provider]    Family History Family History  Problem Relation Age of Onset  . High blood pressure  Mother   . High blood pressure Father   . Cancer Maternal Grandmother     Social History Social History   Tobacco Use  . Smoking status: Passive Smoke Exposure - Never Smoker  . Smokeless tobacco: Never Used  . Tobacco comment: Step dad smokes  Substance Use Topics  . Alcohol use: No    Alcohol/week: 0.0 standard drinks    Frequency: Never  . Drug use: No     Allergies   Amoxicillin; Ethyl alcohol (skin cleanser); Other; Tomato; and Wellbutrin [bupropion]   Review of Systems Review of Systems  Gastrointestinal: Positive for abdominal pain, nausea and vomiting.  All other systems reviewed and are negative.    Physical Exam Updated Vital Signs BP (!) 134/93 (BP Location: Right Arm)   Pulse (!) 109   Temp 99 F (37.2 C) (Oral)   Resp 20   SpO2 100%   Physical Exam Vitals signs and nursing note reviewed.  Constitutional:      Appearance: She is well-developed.  HENT:     Head: Normocephalic.     Mouth/Throat:     Mouth: Mucous membranes are moist.     Pharynx: Oropharynx is clear.  Eyes:     Conjunctiva/sclera: Conjunctivae normal.  Cardiovascular:     Rate and Rhythm: Normal rate and regular rhythm.  Pulmonary:     Effort: Pulmonary effort is normal.     Breath sounds: Normal breath sounds.  Abdominal:     General: Abdomen is flat. Bowel sounds are normal. There is no distension.     Palpations: There is no hepatomegaly or splenomegaly.     Tenderness: There is abdominal tenderness in the epigastric area.  Skin:    General: Skin is warm and dry.  Neurological:     Mental Status: She is alert and oriented to person, place, and time.      ED Treatments / Results  Labs (all labs ordered are listed, but only abnormal results are displayed) Labs Reviewed  COMPREHENSIVE METABOLIC PANEL - Abnormal; Notable for the following components:      Result Value   Total Protein 8.3 (*)    All other components within normal limits  URINALYSIS, ROUTINE W REFLEX  MICROSCOPIC - Abnormal; Notable for the following components:   APPearance HAZY (*)    Leukocytes, UA TRACE (*)    Bacteria, UA RARE (*)    All other components within normal limits  LIPASE, BLOOD  CBC  I-STAT BETA HCG BLOOD, ED ([facility], [facility], [facility] ONLY)    EKG None  Radiology US Abdomen Limited Ruq  Result Date: 12/25/2017 CLINICAL DATA:  Initial evaluation for acute epigastric pain. EXAM: ULTRASOUND ABDOMEN LIMITED RIGHT UPPER QUADRANT COMPARISON:  Prior radiograph from 12/08/2017 FINDINGS: Gallbladder: No gallstones or wall thickening  visualized. No sonographic Murphy sign noted by sonographer. Common bile duct: Diameter: 5 mm Liver: No focal lesion identified. Within normal limits in parenchymal echogenicity. Portal vein is patent on color Doppler imaging with normal direction of blood flow towards the liver. IMPRESSION: Normal right upper quadrant ultrasound. Electronically Signed   By: Jeannine Boga M.D.   On: 12/25/2017 22:30    Procedures Procedures (including critical care time)  Medications Ordered in ED Medications - No data to display   Initial Impression / Assessment and Plan / ED Course  I have reviewed the triage vital signs and the nursing notes.  Pertinent labs & imaging results that were available during my care of the patient were reviewed by me and considered in my medical decision making (see chart for details).    Patient seen on 12/08/17. Chest and abdominal radiographs obtained on that visit reviewed. No acute findings. Ultrasound obtained today reveals no gallstones or wall thickening. Labs reassuring.  Patient is nontoxic, nonseptic appearing, in no apparent distress.  Patient's pain and other symptoms adequately managed in emergency department.  Fluid bolus given.  Labs, imaging and vitals reviewed.  Patient does not meet the SIRS or Sepsis criteria.  On repeat exam patient does not have a surgical abdomen and there are no peritoneal signs.    Patient  discharged home with symptomatic treatment and given strict instructions for follow-up with their primary care physician.  I have also discussed reasons to return immediately to the ER.  Patient expresses understanding and agrees with plan.    Final Clinical Impressions(s) / ED Diagnoses   Final diagnoses:  Epigastric pain    ED Discharge Orders         Ordered    famotidine (PEPCID) 20 MG tablet  2 times daily     12/25/17 2337           Etta Quill, NP 12/25/17 2359    Lacretia Leigh, MD 12/26/17 [phone]

## 2017-12-25 NOTE — ED Notes (Signed)
Attempted to stick patient for blood draw. Patient c/o pain during time of stick. Needle removed per patient request.

## 2017-12-25 NOTE — ED Triage Notes (Signed)
Per [facility] pt from home for abd pains that started today. Had n/v since yesterday and tried taking Zofran without relief.

## 2017-12-25 NOTE — Discharge Instructions (Signed)
Please follow up with your primary care provider.

## 2017-12-28 ENCOUNTER — Emergency Department (HOSPITAL_COMMUNITY): Payer: BLUE CROSS/BLUE SHIELD

## 2017-12-28 ENCOUNTER — Encounter (HOSPITAL_COMMUNITY): Payer: Self-pay | Admitting: Emergency Medicine

## 2017-12-28 ENCOUNTER — Other Ambulatory Visit: Payer: Self-pay

## 2017-12-28 ENCOUNTER — Emergency Department (HOSPITAL_COMMUNITY)
Admission: EM | Admit: 2017-12-28 | Discharge: 2017-12-28 | Disposition: A | Discharge status: Home / Self Care | Payer: BLUE CROSS/BLUE SHIELD | Attending: Emergency Medicine | Admitting: Emergency Medicine

## 2017-12-28 DIAGNOSIS — J45909 Unspecified asthma, uncomplicated: Secondary | ICD-10-CM | POA: Insufficient documentation

## 2017-12-28 DIAGNOSIS — Z7722 Contact with and (suspected) exposure to environmental tobacco smoke (acute) (chronic): Secondary | ICD-10-CM | POA: Diagnosis not present

## 2017-12-28 DIAGNOSIS — K5900 Constipation, unspecified: Secondary | ICD-10-CM | POA: Diagnosis not present

## 2017-12-28 DIAGNOSIS — Z79899 Other long term (current) drug therapy: Secondary | ICD-10-CM | POA: Diagnosis not present

## 2017-12-28 DIAGNOSIS — Z7984 Long term (current) use of oral hypoglycemic drugs: Secondary | ICD-10-CM | POA: Insufficient documentation

## 2017-12-28 DIAGNOSIS — R112 Nausea with vomiting, unspecified: Secondary | ICD-10-CM | POA: Diagnosis not present

## 2017-12-28 LAB — COMPREHENSIVE METABOLIC PANEL
ALT: 15 U/L (ref 0–44)
AST: 18 U/L (ref 15–41)
Albumin: 4.2 g/dL (ref 3.5–5.0)
Alkaline Phosphatase: 60 U/L (ref 38–126)
Anion gap: 7 (ref 5–15)
BUN: 10 mg/dL (ref 6–20)
CO2: 24 mmol/L (ref 22–32)
Calcium: 9 mg/dL (ref 8.9–10.3)
Chloride: 106 mmol/L (ref 98–111)
Creatinine, Ser: 0.76 mg/dL (ref 0.44–1.00)
GFR calc Af Amer: >60 mL/min (ref >60)
GFR calc non Af Amer: >60 mL/min (ref >60)
Glucose, Bld: 78 mg/dL (ref 70–99)
Potassium: 4 mmol/L (ref 3.5–5.1)
Sodium: 137 mmol/L (ref 135–145)
Total Bilirubin: 0.5 mg/dL (ref 0.3–1.2)
Total Protein: 8 g/dL (ref 6.5–8.1)

## 2017-12-28 LAB — CBC
HCT: 40.6 % (ref 36.0–46.0)
Hemoglobin: 12.8 g/dL (ref 12.0–15.0)
MCH: 28.8 pg (ref 26.0–34.0)
MCHC: 31.5 g/dL (ref 30.0–36.0)
MCV: 91.4 fL (ref 80.0–100.0)
Platelets: 270 10*3/uL (ref 150–400)
RBC: 4.44 MIL/uL (ref 3.87–5.11)
RDW: 13.2 % (ref 11.5–15.5)
WBC: 4.6 10*3/uL (ref 4.0–10.5)
nRBC: 0 % (ref 0.0–0.2)

## 2017-12-28 LAB — URINALYSIS, ROUTINE W REFLEX MICROSCOPIC
Bilirubin Urine: NEGATIVE
Glucose, UA: NEGATIVE mg/dL
Ketones, ur: NEGATIVE mg/dL
Leukocytes, UA: NEGATIVE
Nitrite: NEGATIVE
Protein, ur: NEGATIVE mg/dL
Specific Gravity, Urine: 1.011 (ref 1.005–1.030)
pH: 6 (ref 5.0–8.0)

## 2017-12-28 LAB — I-STAT BETA HCG BLOOD, ED (MC, WL, AP ONLY): I-stat hCG, quantitative: <5 m[IU]/mL (ref <5)

## 2017-12-28 LAB — LIPASE, BLOOD: Lipase: 32 U/L (ref 11–51)

## 2017-12-28 MED ORDER — SODIUM CHLORIDE (PF) 0.9 % IJ SOLN
INTRAMUSCULAR | Status: AC
  Filled 2017-12-28: qty 50

## 2017-12-28 MED ORDER — IOPAMIDOL (ISOVUE-300) INJECTION 61%
100.0000 mL | Freq: Once | INTRAVENOUS | Status: AC | PRN
  Administered 2017-12-28: 100 mL via INTRAVENOUS

## 2017-12-28 MED ORDER — IOPAMIDOL (ISOVUE-300) INJECTION 61%
INTRAVENOUS | Status: AC
  Filled 2017-12-28: qty 100

## 2017-12-28 NOTE — Discharge Instructions (Signed)
Use miraLax  for your constipation. Continue to take the pepcid for your upper abdominal pain. Call your doctor tomorrow to schedule follow up for a recheck.

## 2017-12-28 NOTE — ED Provider Notes (Signed)
[facility] DEPT Provider Note   CSN: [id number] Arrival date & time: 12/28/17  1357     History   Chief Complaint Chief Complaint  Patient presents with  . Abdominal Pain  . Constipation    HPI Tina Hale is a 22 y.o. female with hx of anemia, head trauma and mental health problems presents to the ED via EMS with c/o abdominal pain x 3 days with constipation. Last BM per patient was 2 weeks ago. Patient taking stool softeners and laxative without relief. Patient evaluated here 12/12 for epigastric pain and nausea. She had a normal ultrasound of RUQ abdomen on that visit.    HPI  Past Medical History:  Diagnosis Date  . ADD (attention deficit disorder)   . ADHD (attention deficit hyperactivity disorder)   . Anemia   . Anger   . Anxiety   . Asthma   . Depression   . Head trauma   . Headache   . Memory loss   . PTSD (post-traumatic stress disorder)     Patient Active Problem List   Diagnosis Date Noted  . PTSD (post-traumatic stress disorder) 10/29/2017  . Nexplanon removal 08/01/2015  . Nexplanon insertion 08/01/2015  . 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 Migraine without aura and without status migrainosus, not intractable 06/01/2014  . Gait disorder 06/01/2014  . Persistent vomiting 05/04/2014  . Left hip pain 05/04/2014  . Closed head injury with brief loss of consciousness ([place]) 05/04/2014  . New daily persistent headache 03/29/2014  . Neck pain, bilateral 03/29/2014  . Stiff neck 03/29/2014  . Monocular diplopia 03/29/2014  . Left sided numbness 03/29/2014  . Headaches 03/23/2014  . Family circumstance 11/24/2013  . Failed vision screen 11/24/2013  . Obesity 11/24/2013  . Depression 11/24/2013  . Attention deficit hyperactivity disorder 11/24/2013  . Suicidal ideation 11/22/2012  . Homicidal ideation 11/22/2012  . Depression with anxiety 11/22/2012  . Obsessive compulsive disorder  11/22/2012  . 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 Parent-child relational problem 11/22/2012    History reviewed. No pertinent surgical history.   OB History    Gravida  0   Para  0   Term  0   Preterm  0   AB  0   Living        SAB  0   TAB  0   Ectopic  0   Multiple      Live Births               Home Medications    Prior to Admission medications   Medication Sig Start Date End Date Taking? Authorizing Provider  dicyclomine (BENTYL) 20 MG tablet Take 1 tablet (20 mg total) by mouth 2 (two) times daily. 12/09/17   Muthersbaugh, Jarrett Soho, PA-C  famotidine (PEPCID) 20 MG tablet Take 1 tablet (20 mg total) by mouth 2 (two) times daily. 12/25/17   Etta Quill, NP  FLUoxetine (PROZAC) 20 MG capsule Take 1 capsule (20 mg total) by mouth at bedtime. For mood control 11/05/17   Money, Lowry Ram, FNP  hydrOXYzine (ATARAX/VISTARIL) 25 MG tablet Take 1 tablet (25 mg total) by mouth every 6 (six) hours as needed for anxiety. 11/05/17   Money, Lowry Ram, FNP  ibuprofen (ADVIL,MOTRIN) 600 MG tablet Take 1 tablet (600 mg total) by mouth every 6 (six) hours as needed. 12/09/17   Muthersbaugh, Jarrett Soho, PA-C  lisdexamfetamine (VYVANSE) 50 MG capsule Take 50  mg by mouth daily.    [provider]  metFORMIN (GLUCOPHAGE) 500 MG tablet Take 500 mg by mouth 2 (two) times daily. 11/17/17   [provider]  OLANZapine (ZYPREXA) 10 MG tablet Take 1 tablet (10 mg total) by mouth at bedtime. for mood control Patient not taking: Reported on 12/08/2017 11/05/17   Money, Lowry Ram, FNP  ondansetron (ZOFRAN) 4 MG tablet Take 1 tablet (4 mg total) by mouth every 6 (six) hours. 12/09/17   Muthersbaugh, Jarrett Soho, PA-C  prazosin (MINIPRESS) 2 MG capsule Take 1 capsule (2 mg total) by mouth at bedtime. For nightmares 11/05/17   Money, Lowry Ram, FNP    Family History Family History  Problem Relation Age of Onset  . High blood pressure Mother     . High blood pressure Father   . Cancer Maternal Grandmother     Social History Social History   Tobacco Use  . Smoking status: Passive Smoke Exposure - Never Smoker  . Smokeless tobacco: Never Used  . Tobacco comment: Step dad smokes  Substance Use Topics  . Alcohol use: No    Alcohol/week: 0.0 standard drinks    Frequency: Never  . Drug use: No     Allergies   Amoxicillin; Ethyl alcohol (skin cleanser); Other; Tomato; and Wellbutrin [bupropion]   Review of Systems Review of Systems  Constitutional: Negative for chills and fever.  HENT: Negative.   Eyes: Negative for visual disturbance.  Respiratory: Negative for cough and shortness of breath.   Gastrointestinal: Positive for abdominal pain, constipation, nausea and vomiting.  Genitourinary: Negative for dysuria, frequency and urgency.  Musculoskeletal: Negative for back pain.  Skin: Negative for rash.  Neurological: Negative for headaches.  Hematological: Negative for adenopathy.  Psychiatric/Behavioral: Negative for confusion.     Physical Exam Updated Vital Signs BP 136/89 (BP Location: Left Arm)   Pulse 78   Temp 98.9 F (37.2 C) (Oral)   Resp 18   SpO2 100%   Physical Exam Vitals signs and nursing note reviewed.  Constitutional:      General: She is not in acute distress.    Appearance: She is well-developed. She is obese.  HENT:     Head: Normocephalic and atraumatic.  Neck:     Musculoskeletal: Neck supple.  Cardiovascular:     Rate and Rhythm: Normal rate and regular rhythm.  Pulmonary:     Effort: Pulmonary effort is normal.     Breath sounds: Normal breath sounds.  Abdominal:     General: Bowel sounds are normal.     Palpations: Abdomen is soft.     Tenderness: There is generalized abdominal tenderness. There is no right CVA tenderness or left CVA tenderness.     Comments: Minimal tenderness with deep palpation of the abdomen. No guarding or rebound.   Genitourinary:    Rectum: No  tenderness.     Comments: Hard stool palpated in the rectum Musculoskeletal: Normal range of motion.  Skin:    General: Skin is warm and dry.  Neurological:     Mental Status: She is alert and oriented to person, place, and time.     Cranial Nerves: No cranial nerve deficit.      ED Treatments / Results  Labs (all labs ordered are listed, but only abnormal results are displayed) Labs Reviewed  URINALYSIS, ROUTINE W REFLEX MICROSCOPIC - Abnormal; Notable for the following components:      Result Value   Hgb urine dipstick SMALL (*)  Bacteria, UA RARE (*)    All other components within normal limits  LIPASE, BLOOD  COMPREHENSIVE METABOLIC PANEL  CBC  I-STAT BETA HCG BLOOD, ED ([facility], [facility], [facility] ONLY)  Radiology Ct Abdomen Pelvis W Contrast  Result Date: 12/28/2017 CLINICAL DATA:  Constipation and abdominal pain. No bowel movement for 2 weeks. EXAM: CT ABDOMEN AND PELVIS WITH CONTRAST TECHNIQUE: Multidetector CT imaging of the abdomen and pelvis was performed using the standard protocol following bolus administration of intravenous contrast. CONTRAST:  151mL ISOVUE-300 IOPAMIDOL (ISOVUE-300) INJECTION 61% COMPARISON:  None. FINDINGS: LOWER CHEST: There is no basilar pleural or apical pericardial effusion. HEPATOBILIARY: The hepatic contours and density are normal. There is no intra- or extrahepatic biliary dilatation. The gallbladder is normal. PANCREAS: The pancreatic parenchymal contours are normal and there is no ductal dilatation. There is no peripancreatic fluid collection. SPLEEN: Normal. ADRENALS/URINARY TRACT: --Adrenal glands: Normal. --Right kidney/ureter: No hydronephrosis, nephroureterolithiasis, perinephric stranding or solid renal mass. --Left kidney/ureter: No hydronephrosis, nephroureterolithiasis, perinephric stranding or solid renal mass. --Urinary bladder: Normal for degree of distention STOMACH/BOWEL: --Stomach/Duodenum: There is no hiatal hernia or other gastric abnormality.  The duodenal course and caliber are normal. --Small bowel: No dilatation or inflammation. --Colon: No focal abnormality. --Appendix: Normal. VASCULAR/LYMPHATIC: Normal course and caliber of the major abdominal vessels. No abdominal or pelvic lymphadenopathy. REPRODUCTIVE: Status post hysterectomy. No adnexal mass. MUSCULOSKELETAL. No bony spinal canal stenosis or focal osseous abnormality. OTHER: None. IMPRESSION: No acute abdominal or pelvic abnormality. Unremarkable colonic stool volume. Electronically Signed   By: Ulyses Jarred M.D.   On: 12/28/2017 16:09    Procedures Procedures (including critical care time)  Medications Ordered in ED Medications  iopamidol (ISOVUE-300) 61 % injection (has no administration in time range)  sodium chloride (PF) 0.9 % injection (has no administration in time range)  iopamidol (ISOVUE-300) 61 % injection 100 mL (100 mLs Intravenous Contrast Given 12/28/17 1553)     Initial Impression / Assessment and Plan / ED Course  I have reviewed the triage vital signs and the nursing notes. 22 y.o. female here with c/o nausea and abdominal pain stable for d/c without acute abdomen and normal CT of abdomen and pelvis. Will treat for constipation and patient to schedule follow up with her PCP. Discussed diet and constipation. Patient has Mira lax at home that she will take. She will also continue her Pepcid for her upper abdominal pain.   Final Clinical Impressions(s) / ED Diagnoses   Final diagnoses:  Constipation, unspecified constipation type  Nausea and vomiting in adult    ED Discharge Orders    None       Debroah Baller [place], NP 12/28/17 1634    Drenda Freeze, MD 12/30/17 1134

## 2017-12-28 NOTE — ED Notes (Signed)
Patient transported to CT 

## 2017-12-28 NOTE — ED Triage Notes (Signed)
[facility] c/o generalized abdominal pain x3 days. Reports constipation, last BM x2 weeks ago. Reports taking laxative and stool softeners with no relief.  Seen on 12/12 for same.  BP 126/90 HR 80 RR 18 O2 100%

## 2018-12-08 ENCOUNTER — Emergency Department (HOSPITAL_COMMUNITY): Payer: Self-pay

## 2018-12-08 ENCOUNTER — Other Ambulatory Visit: Payer: Self-pay

## 2018-12-08 ENCOUNTER — Encounter (HOSPITAL_COMMUNITY): Payer: Self-pay

## 2018-12-08 ENCOUNTER — Emergency Department (HOSPITAL_COMMUNITY)
Admission: EM | Admit: 2018-12-08 | Discharge: 2018-12-08 | Disposition: A | Discharge status: Home / Self Care | Payer: Self-pay | Attending: Emergency Medicine | Admitting: Emergency Medicine

## 2018-12-08 DIAGNOSIS — F909 Attention-deficit hyperactivity disorder, unspecified type: Secondary | ICD-10-CM | POA: Insufficient documentation

## 2018-12-08 DIAGNOSIS — Z7722 Contact with and (suspected) exposure to environmental tobacco smoke (acute) (chronic): Secondary | ICD-10-CM | POA: Insufficient documentation

## 2018-12-08 DIAGNOSIS — R0789 Other chest pain: Secondary | ICD-10-CM | POA: Insufficient documentation

## 2018-12-08 DIAGNOSIS — R079 Chest pain, unspecified: Secondary | ICD-10-CM

## 2018-12-08 DIAGNOSIS — Z79899 Other long term (current) drug therapy: Secondary | ICD-10-CM | POA: Insufficient documentation

## 2018-12-08 DIAGNOSIS — Z8709 Personal history of other diseases of the respiratory system: Secondary | ICD-10-CM | POA: Insufficient documentation

## 2018-12-08 LAB — I-STAT BETA HCG BLOOD, ED (MC, WL, AP ONLY): I-stat hCG, quantitative: <5 m[IU]/mL (ref <5)

## 2018-12-08 LAB — CBC
HCT: 39.7 % (ref 36.0–46.0)
Hemoglobin: 12.9 g/dL (ref 12.0–15.0)
MCH: 29.3 pg (ref 26.0–34.0)
MCHC: 32.5 g/dL (ref 30.0–36.0)
MCV: 90 fL (ref 80.0–100.0)
Platelets: 223 10*3/uL (ref 150–400)
RBC: 4.41 MIL/uL (ref 3.87–5.11)
RDW: 13.9 % (ref 11.5–15.5)
WBC: 4.9 10*3/uL (ref 4.0–10.5)
nRBC: 0 % (ref 0.0–0.2)

## 2018-12-08 LAB — TROPONIN I (HIGH SENSITIVITY): Troponin I (High Sensitivity): <2 ng/L (ref <18)

## 2018-12-08 LAB — BASIC METABOLIC PANEL
Anion gap: 7 (ref 5–15)
BUN: 8 mg/dL (ref 6–20)
CO2: 22 mmol/L (ref 22–32)
Calcium: 8.9 mg/dL (ref 8.9–10.3)
Chloride: 107 mmol/L (ref 98–111)
Creatinine, Ser: 0.67 mg/dL (ref 0.44–1.00)
GFR calc Af Amer: >60 mL/min (ref >60)
GFR calc non Af Amer: >60 mL/min (ref >60)
Glucose, Bld: 83 mg/dL (ref 70–99)
Potassium: 4.1 mmol/L (ref 3.5–5.1)
Sodium: 136 mmol/L (ref 135–145)

## 2018-12-08 MED ORDER — MELOXICAM 15 MG PO TABS: 15.0000 mg | ORAL_TABLET | Freq: Every day | ORAL | 0 refills | Status: DC | PRN

## 2018-12-08 MED ORDER — SODIUM CHLORIDE 0.9% FLUSH: 3.0000 mL | Freq: Once | INTRAVENOUS | Status: DC

## 2018-12-08 MED ORDER — ALBUTEROL SULFATE HFA 108 (90 BASE) MCG/ACT IN AERS
2.0000 | INHALATION_SPRAY | Freq: Once | RESPIRATORY_TRACT | Status: DC
  Filled 2018-12-08: qty 6.7

## 2018-12-08 NOTE — ED Provider Notes (Signed)
[facility] EMERGENCY DEPARTMENT Provider Note   CSN: [id number] Arrival date & time: 12/08/18  1226     History   Chief Complaint Chief Complaint  Patient presents with  . Chest Pain    HPI Tina Hale is a 23 y.o. female.     HPI   23 year old female with chest pain.  Onset about 2 weeks ago.  Constant.  Somewhat vague historian.  She has a hard time characterizing her pain. When offered descriptors such as "sharp, dull, achy, burning, etc." she replied "all of the above.  Sometimes radiates straight through into her back.  She states that her anterior chest is sore to the touch.  She has tried taking aspirin without much improvement.  No cough.  No fevers or chills.  No unusual leg pain or swelling.  Past Medical History:  Diagnosis Date  . ADD (attention deficit disorder)   . ADHD (attention deficit hyperactivity disorder)   . Anemia   . Anger   . Anxiety   . Asthma   . Depression   . Head trauma   . Headache   . Memory loss   . PTSD (post-traumatic stress disorder)     Patient Active Problem List   Diagnosis Date Noted  . PTSD (post-traumatic stress disorder) 10/29/2017  . Nexplanon removal 08/01/2015  . Nexplanon insertion 08/01/2015  . 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 Migraine without aura and without status migrainosus, not intractable 06/01/2014  . Gait disorder 06/01/2014  . Persistent vomiting 05/04/2014  . Left hip pain 05/04/2014  . Closed head injury with brief loss of consciousness ([place]) 05/04/2014  . New daily persistent headache 03/29/2014  . Neck pain, bilateral 03/29/2014  . Stiff neck 03/29/2014  . Monocular diplopia 03/29/2014  . Left sided numbness 03/29/2014  . Headaches 03/23/2014  . Family circumstance 11/24/2013  . Failed vision screen 11/24/2013  . Obesity 11/24/2013  . Depression 11/24/2013  . Attention deficit hyperactivity disorder 11/24/2013  . Suicidal ideation  11/22/2012  . Homicidal ideation 11/22/2012  . Depression with anxiety 11/22/2012  . Obsessive compulsive disorder 11/22/2012  . 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 Parent-child relational problem 11/22/2012    History reviewed. No pertinent surgical history.   OB History    Gravida  0   Para  0   Term  0   Preterm  0   AB  0   Living        SAB  0   TAB  0   Ectopic  0   Multiple      Live Births               Home Medications    Prior to Admission medications   Medication Sig Start Date End Date Taking? Authorizing Provider  dicyclomine (BENTYL) 20 MG tablet Take 1 tablet (20 mg total) by mouth 2 (two) times daily. 12/09/17   Muthersbaugh, Jarrett Soho, PA-C  famotidine (PEPCID) 20 MG tablet Take 1 tablet (20 mg total) by mouth 2 (two) times daily. 12/25/17   Etta Quill, NP  FLUoxetine (PROZAC) 20 MG capsule Take 1 capsule (20 mg total) by mouth at bedtime. For mood control 11/05/17   Money, Lowry Ram, FNP  hydrOXYzine (ATARAX/VISTARIL) 25 MG tablet Take 1 tablet (25 mg total) by mouth every 6 (six) hours as needed for anxiety. 11/05/17   Money, Lowry Ram, FNP  ibuprofen (ADVIL,MOTRIN) 600 MG tablet Take 1  tablet (600 mg total) by mouth every 6 (six) hours as needed. 12/09/17   Muthersbaugh, Jarrett Soho, PA-C  lisdexamfetamine (VYVANSE) 50 MG capsule Take 50 mg by mouth daily.    [provider]  meloxicam (MOBIC) 15 MG tablet Take 1 tablet (15 mg total) by mouth daily as needed for pain. 12/08/18   Virgel Manifold, MD  metFORMIN (GLUCOPHAGE) 500 MG tablet Take 500 mg by mouth 2 (two) times daily. 11/17/17   [provider]  OLANZapine (ZYPREXA) 10 MG tablet Take 1 tablet (10 mg total) by mouth at bedtime. for mood control Patient not taking: Reported on 12/08/2017 11/05/17   Money, Lowry Ram, FNP  ondansetron (ZOFRAN) 4 MG tablet Take 1 tablet (4 mg total) by mouth every 6 (six) hours. 12/09/17   Muthersbaugh,  Jarrett Soho, PA-C  prazosin (MINIPRESS) 2 MG capsule Take 1 capsule (2 mg total) by mouth at bedtime. For nightmares 11/05/17   Money, Lowry Ram, FNP    Family History Family History  Problem Relation Age of Onset  . High blood pressure Mother   . High blood pressure Father   . Cancer Maternal Grandmother     Social History Social History   Tobacco Use  . Smoking status: Passive Smoke Exposure - Never Smoker  . Smokeless tobacco: Never Used  . Tobacco comment: Step dad smokes  Substance Use Topics  . Alcohol use: No    Alcohol/week: 0.0 standard drinks    Frequency: Never  . Drug use: No     Allergies   Amoxicillin, Ethyl alcohol (skin cleanser), Other, Tomato, and Wellbutrin [bupropion]   Review of Systems Review of Systems  All systems reviewed and negative, other than as noted in HPI.  Physical Exam Updated Vital Signs BP 115/73 (BP Location: Right Arm)   Pulse 100   Temp 98.9 F (37.2 C) (Oral)   Resp 18   SpO2 100%   Physical Exam Vitals signs and nursing note reviewed.  Constitutional:      General: She is not in acute distress.    Appearance: She is well-developed. She is obese.  HENT:     Head: Normocephalic and atraumatic.  Eyes:     General:        Right eye: No discharge.        Left eye: No discharge.     Conjunctiva/sclera: Conjunctivae normal.  Neck:     Musculoskeletal: Neck supple.  Cardiovascular:     Rate and Rhythm: Normal rate and regular rhythm.     Heart sounds: Normal heart sounds. No murmur. No friction rub. No gallop.   Pulmonary:     Effort: Pulmonary effort is normal. No respiratory distress.     Breath sounds: Normal breath sounds.  Chest:     Chest wall: Tenderness present.  Abdominal:     General: There is no distension.     Palpations: Abdomen is soft.     Tenderness: There is no abdominal tenderness.  Musculoskeletal:        General: No tenderness.     Comments: Lower extremities symmetric as compared to each other. No  calf tenderness. Negative Homan's. No palpable cords.   Skin:    General: Skin is warm and dry.  Neurological:     Mental Status: She is alert.  Psychiatric:        Behavior: Behavior normal.        Thought Content: Thought content normal.      ED Treatments / Results  Labs (  all labs ordered are listed, but only abnormal results are displayed) Labs Reviewed  BASIC METABOLIC PANEL  CBC  I-STAT BETA HCG BLOOD, ED ([place], [facility], [facility] ONLY)  TROPONIN I (HIGH SENSITIVITY)  TROPONIN I (HIGH SENSITIVITY)    EKG EKG Interpretation  Date/Time:  Tuesday December 08 2018 12:34:57 EST Ventricular Rate:  79 PR Interval:  152 QRS Duration: 70 QT Interval:  364 QTC Calculation: 417 R Axis:   67 Text Interpretation: Normal sinus rhythm with sinus arrhythmia Normal ECG Confirmed by Virgel Manifold [phone]) on 12/08/2018 3:07:14 PM   Radiology Dg Chest 2 View  Result Date: 12/08/2018 CLINICAL DATA:  Chest pain. Additional history provided: Chest pain for the past 2 weeks, beginnings radiate to back, history of asthma EXAM: CHEST - 2 VIEW COMPARISON:  Chest radiograph 12/08/2017 FINDINGS: Heart size within normal limits. There is no airspace consolidation within the lungs. No evidence of pleural effusion or pneumothorax. No acute bony abnormality.  Mild thoracic levocurvature. Overlying cardiac monitoring leads. IMPRESSION: No evidence of acute cardiopulmonary disease. Electronically Signed   By: Kellie Simmering DO   On: 12/08/2018 13:09    Procedures Procedures (including critical care time)  Medications Ordered in ED Medications  sodium chloride flush (NS) 0.9 % injection 3 mL (has no administration in time range)  albuterol (VENTOLIN HFA) 108 (90 Base) MCG/ACT inhaler 2 puff (has no administration in time range)     Initial Impression / Assessment and Plan / ED Course  I have reviewed the triage vital signs and the nursing notes.  Pertinent labs & imaging results that were available  during my care of the patient were reviewed by me and considered in my medical decision making (see chart for details).  23 year old female with chest pain.  Atypical for ACS.  Doubt PE, dissection or other emergent process.  EKG no overt ischemic changes.  Troponin normal.  Chest x-ray no acute abnormality.  No signs or symptoms of DVT.  Possibly chest wall pain.  She is somewhat tender on exam.  Back pain is nonreproducible palpation though.  Regardless, I doubt serious condition.  Of note, albuterol inhaler was provided upon request.  I do not appreciate wheezing on my examination.  Final Clinical Impressions(s) / ED Diagnoses   Final diagnoses:  Chest pain, unspecified type    ED Discharge Orders         Ordered    meloxicam (MOBIC) 15 MG tablet  Daily PRN     12/08/18 1519           Virgel Manifold, MD 12/08/18 1527

## 2018-12-08 NOTE — ED Triage Notes (Signed)
Pt was seen at [facility] today-- with chest pain and back pain. Was told to go to a cardiologist's office, but came here because "the pain is too intense" for 2 weeks

## 2018-12-08 NOTE — ED Notes (Signed)
Discharge instructions reviewed with pt. Pt verbalized understanding.   

## 2018-12-08 NOTE — ED Triage Notes (Signed)
Pt reports chest pain for the past 2 weeks, pt sent from [facility] for further evaluation. Pt given 324 ASA and 15mg  toradol at [facility]. VSS, pt a.o

## 2018-12-22 ENCOUNTER — Emergency Department (HOSPITAL_COMMUNITY)
Admission: EM | Admit: 2018-12-22 | Discharge: 2018-12-22 | Disposition: A | Discharge status: Other Acute Inpatient Hospital | Payer: Medicaid Other | Attending: Emergency Medicine | Admitting: Emergency Medicine

## 2018-12-22 ENCOUNTER — Other Ambulatory Visit: Payer: Self-pay | Admitting: Behavioral Health

## 2018-12-22 ENCOUNTER — Inpatient Hospital Stay (HOSPITAL_COMMUNITY)
Admission: AD | Admit: 2018-12-22 | Discharge: 2018-12-29 | DRG: 881 | Disposition: A | Discharge status: Home / Self Care | Payer: BC Managed Care – PPO | Source: Intra-hospital | Attending: Psychiatry | Admitting: Psychiatry

## 2018-12-22 ENCOUNTER — Other Ambulatory Visit: Payer: Self-pay

## 2018-12-22 ENCOUNTER — Encounter (HOSPITAL_COMMUNITY): Payer: Self-pay | Admitting: Clinical

## 2018-12-22 DIAGNOSIS — Z20828 Contact with and (suspected) exposure to other viral communicable diseases: Secondary | ICD-10-CM | POA: Diagnosis present

## 2018-12-22 DIAGNOSIS — G4700 Insomnia, unspecified: Secondary | ICD-10-CM | POA: Diagnosis present

## 2018-12-22 DIAGNOSIS — J45909 Unspecified asthma, uncomplicated: Secondary | ICD-10-CM | POA: Diagnosis present

## 2018-12-22 DIAGNOSIS — Z881 Allergy status to other antibiotic agents status: Secondary | ICD-10-CM

## 2018-12-22 DIAGNOSIS — Z888 Allergy status to other drugs, medicaments and biological substances status: Secondary | ICD-10-CM | POA: Diagnosis not present

## 2018-12-22 DIAGNOSIS — R45851 Suicidal ideations: Secondary | ICD-10-CM | POA: Insufficient documentation

## 2018-12-22 DIAGNOSIS — F332 Major depressive disorder, recurrent severe without psychotic features: Secondary | ICD-10-CM | POA: Diagnosis not present

## 2018-12-22 DIAGNOSIS — Z91410 Personal history of adult physical and sexual abuse: Secondary | ICD-10-CM | POA: Diagnosis not present

## 2018-12-22 DIAGNOSIS — M25531 Pain in right wrist: Secondary | ICD-10-CM | POA: Diagnosis not present

## 2018-12-22 DIAGNOSIS — R413 Other amnesia: Secondary | ICD-10-CM | POA: Diagnosis present

## 2018-12-22 DIAGNOSIS — Z791 Long term (current) use of non-steroidal anti-inflammatories (NSAID): Secondary | ICD-10-CM | POA: Insufficient documentation

## 2018-12-22 DIAGNOSIS — F909 Attention-deficit hyperactivity disorder, unspecified type: Secondary | ICD-10-CM | POA: Diagnosis present

## 2018-12-22 DIAGNOSIS — G8929 Other chronic pain: Secondary | ICD-10-CM | POA: Diagnosis present

## 2018-12-22 DIAGNOSIS — R4584 Anhedonia: Secondary | ICD-10-CM | POA: Diagnosis present

## 2018-12-22 DIAGNOSIS — F4310 Post-traumatic stress disorder, unspecified: Secondary | ICD-10-CM | POA: Diagnosis present

## 2018-12-22 DIAGNOSIS — F329 Major depressive disorder, single episode, unspecified: Secondary | ICD-10-CM | POA: Diagnosis present

## 2018-12-22 DIAGNOSIS — Z915 Personal history of self-harm: Secondary | ICD-10-CM | POA: Diagnosis not present

## 2018-12-22 DIAGNOSIS — F419 Anxiety disorder, unspecified: Secondary | ICD-10-CM | POA: Diagnosis present

## 2018-12-22 DIAGNOSIS — Z818 Family history of other mental and behavioral disorders: Secondary | ICD-10-CM | POA: Diagnosis not present

## 2018-12-22 DIAGNOSIS — Z79899 Other long term (current) drug therapy: Secondary | ICD-10-CM | POA: Insufficient documentation

## 2018-12-22 DIAGNOSIS — F17210 Nicotine dependence, cigarettes, uncomplicated: Secondary | ICD-10-CM | POA: Diagnosis present

## 2018-12-22 DIAGNOSIS — Z91018 Allergy to other foods: Secondary | ICD-10-CM

## 2018-12-22 DIAGNOSIS — M549 Dorsalgia, unspecified: Secondary | ICD-10-CM | POA: Diagnosis present

## 2018-12-22 DIAGNOSIS — Z7722 Contact with and (suspected) exposure to environmental tobacco smoke (acute) (chronic): Secondary | ICD-10-CM | POA: Insufficient documentation

## 2018-12-22 DIAGNOSIS — T39312A Poisoning by propionic acid derivatives, intentional self-harm, initial encounter: Secondary | ICD-10-CM | POA: Diagnosis present

## 2018-12-22 LAB — CBC
HCT: 38.7 % (ref 36.0–46.0)
Hemoglobin: 12.6 g/dL (ref 12.0–15.0)
MCH: 29.4 pg (ref 26.0–34.0)
MCHC: 32.6 g/dL (ref 30.0–36.0)
MCV: 90.2 fL (ref 80.0–100.0)
Platelets: 251 10*3/uL (ref 150–400)
RBC: 4.29 MIL/uL (ref 3.87–5.11)
RDW: 13.6 % (ref 11.5–15.5)
WBC: 3.7 10*3/uL — ABNORMAL LOW (ref 4.0–10.5)
nRBC: 0 % (ref 0.0–0.2)

## 2018-12-22 LAB — COMPREHENSIVE METABOLIC PANEL
ALT: 29 U/L (ref 0–44)
AST: 28 U/L (ref 15–41)
Albumin: 3.2 g/dL — ABNORMAL LOW (ref 3.5–5.0)
Alkaline Phosphatase: 65 U/L (ref 38–126)
Anion gap: 9 (ref 5–15)
BUN: 11 mg/dL (ref 6–20)
CO2: 23 mmol/L (ref 22–32)
Calcium: 8.6 mg/dL — ABNORMAL LOW (ref 8.9–10.3)
Chloride: 106 mmol/L (ref 98–111)
Creatinine, Ser: 0.74 mg/dL (ref 0.44–1.00)
GFR calc Af Amer: >60 mL/min (ref >60)
GFR calc non Af Amer: >60 mL/min (ref >60)
Glucose, Bld: 89 mg/dL (ref 70–99)
Potassium: 4.4 mmol/L (ref 3.5–5.1)
Sodium: 138 mmol/L (ref 135–145)
Total Bilirubin: 0.7 mg/dL (ref 0.3–1.2)
Total Protein: 7 g/dL (ref 6.5–8.1)

## 2018-12-22 LAB — RAPID URINE DRUG SCREEN, HOSP PERFORMED
Amphetamines: NOT DETECTED
Barbiturates: NOT DETECTED
Benzodiazepines: NOT DETECTED
Cocaine: NOT DETECTED
Opiates: NOT DETECTED
Tetrahydrocannabinol: NOT DETECTED

## 2018-12-22 LAB — I-STAT BETA HCG BLOOD, ED (MC, WL, AP ONLY): I-stat hCG, quantitative: <5 m[IU]/mL (ref <5)

## 2018-12-22 LAB — SALICYLATE LEVEL: Salicylate Lvl: <7 mg/dL (ref 2.8–30.0)

## 2018-12-22 LAB — ACETAMINOPHEN LEVEL: Acetaminophen (Tylenol), Serum: <10 ug/mL — ABNORMAL LOW (ref 10–30)

## 2018-12-22 LAB — ETHANOL: Alcohol, Ethyl (B): <10 mg/dL (ref <10)

## 2018-12-22 LAB — SARS CORONAVIRUS 2 (TAT 6-24 HRS): SARS Coronavirus 2: NEGATIVE

## 2018-12-22 NOTE — [facility] Counselor (Signed)
TTS contacted [facility] ED to see patient for assessment. No rooms available to place patient in. States they will call when a room becomes available.

## 2018-12-22 NOTE — [facility] Counselor (Signed)
Per Nira Conn, [facility] patient accepted to [facility]. May come ASAP Call report to [phone].
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
## 2018-12-22 NOTE — [facility] Counselor (Addendum)
Disposition: Mordecai Maes, NP recommends in patient treatment pending medical clearance.  [facility] AC, Heather, reviewing  [place], RN notified.

## 2018-12-22 NOTE — ED Notes (Signed)
Pt voiced understanding and agreement w/tx plan - Accepted to [facility] - Signed consent form - Copy faxed to [facility] - Copy sent to Medical Records - Original placed in envelope for [facility] - ALL Belongings - 2 labeled bags and 1 valuables envelope - Safe Transport - Pt aware.

## 2018-12-22 NOTE — ED Triage Notes (Addendum)
Pt reports stopped taking all psychiatric medications a month ago. Doesn't want to be here anymore. Reports suicidal ideations. Hasnt followed up with psychiatrist in 6 omnths. Calm, cooperative at present. Pt reports overdose attempt with tylenol, motrin, naprix, advil at 3 hours ago.

## 2018-12-22 NOTE — ED Notes (Signed)
Lunch Tray Ordered @ 1042. 

## 2018-12-22 NOTE — ED Provider Notes (Signed)
[facility] EMERGENCY DEPARTMENT Provider Note   CSN: [id number] Arrival date & time: 12/22/18  [id number]     History   Chief Complaint Chief Complaint  Patient presents with  . Suicidal    HPI Tina Hale is a 23 y.o. female presenting for evaluation of suicidal thoughts.   Patient states she has chronic chest, back, and abdominal pain, which is making her feel suicidal.  Patient states because she "cannot get the help she needs, she does not want to live."  Patient states she has been seen in the ED for this, referred to follow-up outpatient with cardiology and orthopedics, but has not done so.  Patient states every day she is taking a combination of Motrin, Tylenol, Advil, naproxen, aspirin, and an unknown pill for pain (likely a NSAID or tylenol).  Patient states she took 2 of each of these last night at 8 PM and 2 of each of these this morning at 6 AM and attempt both to help with the pain and as suicidal attempt.  Patient states she has tried to hurt herself before with pill overdose, she has never engaged in any cutting or other self-harm behaviors.  Patient states she used to be on medication for her mood/depression/anxiety, stopped several months ago because she like she did not need it.  She states she is not currently seeing a therapist.  When asked if she has homicidal thoughts, patient states "yes but no."  When asked to clarify, she states she is not having suicidal thoughts.  She denies auditory or visual hallucinations.  She denies nausea, vomiting, change in her pain.     HPI  Past Medical History:  Diagnosis Date  . ADD (attention deficit disorder)   . ADHD (attention deficit hyperactivity disorder)   . Anemia   . Anger   . Anxiety   . Asthma   . Depression   . Head trauma   . Headache   . Memory loss   . PTSD (post-traumatic stress disorder)     Patient Active Problem List   Diagnosis Date Noted  . PTSD (post-traumatic stress disorder)  10/29/2017  . Nexplanon removal 08/01/2015  . Nexplanon insertion 08/01/2015  . 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 Migraine without aura and without status migrainosus, not intractable 06/01/2014  . Gait disorder 06/01/2014  . Persistent vomiting 05/04/2014  . Left hip pain 05/04/2014  . Closed head injury with brief loss of consciousness ([place]) 05/04/2014  . New daily persistent headache 03/29/2014  . Neck pain, bilateral 03/29/2014  . Stiff neck 03/29/2014  . Monocular diplopia 03/29/2014  . Left sided numbness 03/29/2014  . Headaches 03/23/2014  . Family circumstance 11/24/2013  . Failed vision screen 11/24/2013  . Obesity 11/24/2013  . Depression 11/24/2013  . Attention deficit hyperactivity disorder 11/24/2013  . Suicidal ideation 11/22/2012  . Homicidal ideation 11/22/2012  . Depression with anxiety 11/22/2012  . Obsessive compulsive disorder 11/22/2012  . 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 Parent-child relational problem 11/22/2012    History reviewed. No pertinent surgical history.   OB History    Gravida  0   Para  0   Term  0   Preterm  0   AB  0   Living        SAB  0   TAB  0   Ectopic  0   Multiple      Live Births  Home Medications    Prior to Admission medications   Medication Sig Start Date End Date Taking? Authorizing Provider  dicyclomine (BENTYL) 20 MG tablet Take 1 tablet (20 mg total) by mouth 2 (two) times daily. 12/09/17   Muthersbaugh, Jarrett Soho, PA-C  famotidine (PEPCID) 20 MG tablet Take 1 tablet (20 mg total) by mouth 2 (two) times daily. 12/25/17   Etta Quill, NP  FLUoxetine (PROZAC) 20 MG capsule Take 1 capsule (20 mg total) by mouth at bedtime. For mood control 11/05/17   Money, Lowry Ram, FNP  hydrOXYzine (ATARAX/VISTARIL) 25 MG tablet Take 1 tablet (25 mg total) by mouth every 6 (six) hours as needed for anxiety. 11/05/17    Money, Lowry Ram, FNP  ibuprofen (ADVIL,MOTRIN) 600 MG tablet Take 1 tablet (600 mg total) by mouth every 6 (six) hours as needed. 12/09/17   Muthersbaugh, Jarrett Soho, PA-C  lisdexamfetamine (VYVANSE) 50 MG capsule Take 50 mg by mouth daily.    [provider]  meloxicam (MOBIC) 15 MG tablet Take 1 tablet (15 mg total) by mouth daily as needed for pain. 12/08/18   Virgel Manifold, MD  metFORMIN (GLUCOPHAGE) 500 MG tablet Take 500 mg by mouth 2 (two) times daily. 11/17/17   [provider]  OLANZapine (ZYPREXA) 10 MG tablet Take 1 tablet (10 mg total) by mouth at bedtime. for mood control Patient not taking: Reported on 12/08/2017 11/05/17   Money, Lowry Ram, FNP  ondansetron (ZOFRAN) 4 MG tablet Take 1 tablet (4 mg total) by mouth every 6 (six) hours. 12/09/17   Muthersbaugh, Jarrett Soho, PA-C  prazosin (MINIPRESS) 2 MG capsule Take 1 capsule (2 mg total) by mouth at bedtime. For nightmares 11/05/17   Money, Lowry Ram, FNP    Family History Family History  Problem Relation Age of Onset  . High blood pressure Mother   . High blood pressure Father   . Cancer Maternal Grandmother     Social History Social History   Tobacco Use  . Smoking status: Passive Smoke Exposure - Never Smoker  . Smokeless tobacco: Never Used  . Tobacco comment: Step dad smokes  Substance Use Topics  . Alcohol use: No    Alcohol/week: 0.0 standard drinks    Frequency: Never  . Drug use: No     Allergies   Amoxicillin, Ethyl alcohol (skin cleanser), Other, Tomato, and Wellbutrin [bupropion]   Review of Systems Review of Systems  Cardiovascular: Positive for chest pain (Chronic).  Gastrointestinal: Positive for abdominal pain (Chronic).  Musculoskeletal: Positive for back pain (Chronic).  Psychiatric/Behavioral: Positive for suicidal ideas.  All other systems reviewed and are negative.    Physical Exam Updated Vital Signs BP (!) 124/97 (BP Location: Left Arm)   Pulse 85   Temp 98.6 F (37 C)    Resp 17   Ht 5\' 6"  (1.676 m)   Wt 113.4 kg   SpO2 100%   BMI 40.35 kg/m   Physical Exam Vitals signs and nursing note reviewed.  Constitutional:      General: She is not in acute distress.    Appearance: She is well-developed.     Comments: Obese female resting comfortably in bed in no acute distress  HENT:     Head: Normocephalic and atraumatic.  Eyes:     Conjunctiva/sclera: Conjunctivae normal.     Pupils: Pupils are equal, round, and reactive to light.  Neck:     Musculoskeletal: Normal range of motion and neck supple.  Cardiovascular:     Rate and  Rhythm: Normal rate and regular rhythm.  Pulmonary:     Effort: Pulmonary effort is normal. No respiratory distress.     Breath sounds: Normal breath sounds. No wheezing.  Abdominal:     General: There is no distension.     Palpations: Abdomen is soft. There is no mass.     Tenderness: There is no abdominal tenderness. There is no guarding or rebound.  Musculoskeletal: Normal range of motion.  Skin:    General: Skin is warm and dry.  Neurological:     Mental Status: She is alert and oriented to person, place, and time.  Psychiatric:        Mood and Affect: Affect is flat.        Speech: She is noncommunicative.        Behavior: Behavior is withdrawn. Behavior is cooperative.        Thought Content: Thought content includes suicidal ideation. Thought content includes suicidal plan.     Comments: Voiding eye contact.  Flat affect.  Reporting SI.  When asked about HI, responds "yes but no."       ED Treatments / Results  Labs (all labs ordered are listed, but only abnormal results are displayed) Labs Reviewed  COMPREHENSIVE METABOLIC PANEL - Abnormal; Notable for the following components:      Result Value   Calcium 8.6 (*)    Albumin 3.2 (*)    All other components within normal limits  ACETAMINOPHEN LEVEL - Abnormal; Notable for the following components:   Acetaminophen (Tylenol), Serum <10 (*)    All other  components within normal limits  CBC - Abnormal; Notable for the following components:   WBC 3.7 (*)    All other components within normal limits  SARS CORONAVIRUS 2 (TAT 6-24 HRS)  ETHANOL  SALICYLATE LEVEL  RAPID URINE DRUG SCREEN, HOSP PERFORMED  I-STAT BETA HCG BLOOD, ED ([facility], [facility], [facility] ONLY)    EKG EKG Interpretation  Date/Time:  Tuesday December 22 2018 07:03:38 EST Ventricular Rate:  83 PR Interval:  154 QRS Duration: 82 QT Interval:  384 QTC Calculation: 451 R Axis:   64 Text Interpretation: Normal sinus rhythm Nonspecific T wave abnormality Abnormal ECG Confirmed by Madalyn Rob [id number]) on 12/22/2018 10:11:17 AM   Radiology No results found.  Procedures Procedures (including critical care time)  Medications Ordered in ED Medications - No data to display   Initial Impression / Assessment and Plan / ED Course  I have reviewed the triage vital signs and the nursing notes.  Pertinent labs & imaging results that were available during my care of the patient were reviewed by me and considered in my medical decision making (see chart for details).        Pt presenting for evaluation of SI. physical exam shows pt who appears nontoxic.  No nausea, vomiting, or abdominal symptoms.  Patient did not ingest a significant amount of Tylenol, only 2 pills.  She did take multiple NSAID medications, will check salicylate level and creatinine.  However considering she is young and healthy, I have low suspicion for concerning overdose.  Labs reassuring.  Acetaminophen and salicylate level negative.  Creatinine and LFTs normal.  At this time, patient is medically cleared for TTS evaluation.  TTS recommending inpatient treatment. covid pending. Pt state she is not taking daily meds, no home meds ordered.   Final Clinical Impressions(s) / ED Diagnoses   Final diagnoses:  Suicidal ideation  Other chronic pain    ED Discharge  Orders    None       Franchot Heidelberg, PA-C  12/22/18 1115    Lucrezia Starch, MD 12/23/18 1404

## 2018-12-23 ENCOUNTER — Other Ambulatory Visit: Payer: Self-pay

## 2018-12-23 ENCOUNTER — Encounter (HOSPITAL_COMMUNITY): Payer: Self-pay

## 2018-12-23 DIAGNOSIS — F332 Major depressive disorder, recurrent severe without psychotic features: Secondary | ICD-10-CM

## 2018-12-23 LAB — LIPID PANEL
Cholesterol: 95 mg/dL (ref 0–200)
HDL: 43 mg/dL (ref >40)
LDL Cholesterol: 37 mg/dL (ref 0–99)
Total CHOL/HDL Ratio: 2.2 RATIO
Triglycerides: 75 mg/dL (ref <150)
VLDL: 15 mg/dL (ref 0–40)

## 2018-12-23 LAB — HEMOGLOBIN A1C
Hgb A1c MFr Bld: 5.5 % (ref 4.8–5.6)
Mean Plasma Glucose: 111.15 mg/dL

## 2018-12-23 LAB — TSH: TSH: 2.888 u[IU]/mL (ref 0.350–4.500)

## 2018-12-23 MED ORDER — GABAPENTIN 100 MG PO CAPS
100.0000 mg | ORAL_CAPSULE | Freq: Three times a day (TID) | ORAL | Status: DC
  Administered 2018-12-23 – 2018-12-25 (×6): 100 mg via ORAL
  Filled 2018-12-23 (×11): qty 1

## 2018-12-23 MED ORDER — METFORMIN HCL 500 MG PO TABS
500.0000 mg | ORAL_TABLET | Freq: Every day | ORAL | Status: DC
  Administered 2018-12-24 – 2018-12-29 (×6): 500 mg via ORAL
  Filled 2018-12-23 (×5): qty 1
  Filled 2018-12-23 (×2): qty 7
  Filled 2018-12-23: qty 1

## 2018-12-23 MED ORDER — SUCRALFATE 1 G PO TABS
1.0000 g | ORAL_TABLET | Freq: Three times a day (TID) | ORAL | Status: DC
  Administered 2018-12-23 – 2018-12-29 (×23): 1 g via ORAL
  Filled 2018-12-23 (×9): qty 1
  Filled 2018-12-23: qty 28
  Filled 2018-12-23 (×2): qty 1
  Filled 2018-12-23: qty 28
  Filled 2018-12-23 (×12): qty 1
  Filled 2018-12-23: qty 28
  Filled 2018-12-23 (×2): qty 1
  Filled 2018-12-23: qty 28
  Filled 2018-12-23 (×6): qty 1

## 2018-12-23 MED ORDER — DULOXETINE HCL 30 MG PO CPEP: 30.0000 mg | ORAL_CAPSULE | Freq: Every day | ORAL | Status: DC

## 2018-12-23 MED ORDER — TRAMADOL HCL 50 MG PO TABS
50.0000 mg | ORAL_TABLET | Freq: Two times a day (BID) | ORAL | Status: DC | PRN
  Administered 2018-12-23 – 2018-12-25 (×4): 50 mg via ORAL
  Filled 2018-12-23 (×5): qty 1

## 2018-12-23 MED ORDER — DULOXETINE HCL 20 MG PO CPEP
20.0000 mg | ORAL_CAPSULE | Freq: Every day | ORAL | Status: DC
  Administered 2018-12-24: 20 mg via ORAL
  Filled 2018-12-23 (×3): qty 1

## 2018-12-23 MED ORDER — PANTOPRAZOLE SODIUM 40 MG PO TBEC
40.0000 mg | DELAYED_RELEASE_TABLET | Freq: Every day | ORAL | Status: DC
  Administered 2018-12-23 – 2018-12-29 (×7): 40 mg via ORAL
  Filled 2018-12-23 (×5): qty 1
  Filled 2018-12-23: qty 7
  Filled 2018-12-23 (×2): qty 1
  Filled 2018-12-23: qty 7
  Filled 2018-12-23: qty 1

## 2018-12-23 MED ORDER — LORAZEPAM 0.5 MG PO TABS
0.5000 mg | ORAL_TABLET | Freq: Four times a day (QID) | ORAL | Status: DC | PRN
  Administered 2018-12-23 – 2018-12-27 (×4): 0.5 mg via ORAL
  Filled 2018-12-23 (×4): qty 1

## 2018-12-23 NOTE — Progress Notes (Signed)
Patient was in dining room for dinner standing in line.  Patient stated she felt dizzy and sat on the floor, that she did not fall on the floor.  Patient's VS were taken and recorded.  Patient was offered by [facility] to have her dinner brought to her, that she also could have gone to dining room in her wheelchair, but patient refused, walking to dining room by herself.  Patient stated she did not fall, that she sat on the floor and was not hurt.  Staff has encouraged patient to always use the wheelchair and to let staff know immediately if she feels dizzy.  Patient agreed. Respirations even and unlabored.  No signs/symptoms of pain/distress noted on patient's face/body movements.

## 2018-12-23 NOTE — Tx Team (Signed)
Interdisciplinary Treatment and Diagnostic Plan Update  12/23/2018 Time of Session: 9am Tina Hale MRN: [id number]  Principal Diagnosis: MDD (major depressive disorder)  Secondary Diagnoses: Principal Problem:   MDD (major depressive disorder)   Current Medications:  No current facility-administered medications for this encounter.    PTA Medications: Medications Prior to Admission  Medication Sig Dispense Refill Last Dose  . dicyclomine (BENTYL) 20 MG tablet Take 1 tablet (20 mg total) by mouth 2 (two) times daily. 20 tablet 0   . famotidine (PEPCID) 20 MG tablet Take 1 tablet (20 mg total) by mouth 2 (two) times daily. (Patient not taking: Reported on 12/22/2018) 20 tablet 0   . FLUoxetine (PROZAC) 20 MG capsule Take 1 capsule (20 mg total) by mouth at bedtime. For mood control 30 capsule 0   . Fluticasone-Salmeterol (ADVAIR DISKUS) 100-50 MCG/DOSE AEPB Inhale 1 puff into the lungs daily as needed (asthma).      . hydrOXYzine (ATARAX/VISTARIL) 25 MG tablet Take 1 tablet (25 mg total) by mouth every 6 (six) hours as needed for anxiety. 30 tablet 0   . ibuprofen (ADVIL) 800 MG tablet Take 800 mg by mouth every 6 (six) hours as needed for mild pain or moderate pain.      Marland Kitchen ibuprofen (ADVIL,MOTRIN) 600 MG tablet Take 1 tablet (600 mg total) by mouth every 6 (six) hours as needed. (Patient not taking: Reported on 12/22/2018) 10 tablet 0   . meloxicam (MOBIC) 15 MG tablet Take 1 tablet (15 mg total) by mouth daily as needed for pain. 10 tablet 0   . metFORMIN (GLUCOPHAGE) 500 MG tablet Take 500 mg by mouth 2 (two) times daily.  0   . methocarbamol (ROBAXIN) 500 MG tablet Take 500 mg by mouth 3 (three) times daily.     . naproxen (NAPROSYN) 500 MG tablet Take 500 mg by mouth 2 (two) times daily.     Marland Kitchen OLANZapine (ZYPREXA) 10 MG tablet Take 1 tablet (10 mg total) by mouth at bedtime. for mood control (Patient not taking: Reported on 12/08/2017) 30 tablet 0   . ondansetron (ZOFRAN) 4 MG tablet  Take 1 tablet (4 mg total) by mouth every 6 (six) hours. (Patient not taking: Reported on 12/22/2018) 12 tablet 0   . prazosin (MINIPRESS) 2 MG capsule Take 1 capsule (2 mg total) by mouth at bedtime. For nightmares (Patient not taking: Reported on 12/22/2018) 30 capsule 0     Patient Stressors: Financial difficulties Health problems Medication change or noncompliance  Patient Strengths: Ability for insight Average or above average intelligence Capable of independent living Motivation for treatment/growth Supportive family/friends  Treatment Modalities: Medication Management, Group therapy, Case management,  1 to 1 session with clinician, Psychoeducation, Recreational therapy.   Physician Treatment Plan for Primary Diagnosis: MDD (major depressive disorder) Long Term Goal(s): Improvement in symptoms so as ready for discharge Improvement in symptoms so as ready for discharge   Short Term Goals: Ability to identify changes in lifestyle to reduce recurrence of condition will improve Ability to disclose and discuss suicidal ideas Ability to demonstrate self-control will improve Ability to identify and develop effective coping behaviors will improve Ability to maintain clinical measurements within normal limits will improve Compliance with prescribed medications will improve Ability to identify triggers associated with substance abuse/mental health issues will improve Ability to identify changes in lifestyle to reduce recurrence of condition will improve Ability to disclose and discuss suicidal ideas Ability to demonstrate self-control will improve Ability to identify and develop effective  coping behaviors will improve Ability to maintain clinical measurements within normal limits will improve Compliance with prescribed medications will improve Ability to identify triggers associated with substance abuse/mental health issues will improve  Medication Management: Evaluate patient's  response, side effects, and tolerance of medication regimen.  Therapeutic Interventions: 1 to 1 sessions, Unit Group sessions and Medication administration.  Evaluation of Outcomes: Not Met  Physician Treatment Plan for Secondary Diagnosis: Principal Problem:   MDD (major depressive disorder)  Long Term Goal(s): Improvement in symptoms so as ready for discharge Improvement in symptoms so as ready for discharge   Short Term Goals: Ability to identify changes in lifestyle to reduce recurrence of condition will improve Ability to disclose and discuss suicidal ideas Ability to demonstrate self-control will improve Ability to identify and develop effective coping behaviors will improve Ability to maintain clinical measurements within normal limits will improve Compliance with prescribed medications will improve Ability to identify triggers associated with substance abuse/mental health issues will improve Ability to identify changes in lifestyle to reduce recurrence of condition will improve Ability to disclose and discuss suicidal ideas Ability to demonstrate self-control will improve Ability to identify and develop effective coping behaviors will improve Ability to maintain clinical measurements within normal limits will improve Compliance with prescribed medications will improve Ability to identify triggers associated with substance abuse/mental health issues will improve     Medication Management: Evaluate patient's response, side effects, and tolerance of medication regimen.  Therapeutic Interventions: 1 to 1 sessions, Unit Group sessions and Medication administration.  Evaluation of Outcomes: Not Met   RN Treatment Plan for Primary Diagnosis: MDD (major depressive disorder) Long Term Goal(s): Knowledge of disease and therapeutic regimen to maintain health will improve  Short Term Goals: Ability to verbalize feelings will improve, Ability to disclose and discuss suicidal ideas and  Ability to identify and develop effective coping behaviors will improve  Medication Management: RN will administer medications as ordered by provider, will assess and evaluate patient's response and provide education to patient for prescribed medication. RN will report any adverse and/or side effects to prescribing provider.  Therapeutic Interventions: 1 on 1 counseling sessions, Psychoeducation, Medication administration, Evaluate responses to treatment, Monitor vital signs and CBGs as ordered, Perform/monitor CIWA, COWS, AIMS and Fall Risk screenings as ordered, Perform wound care treatments as ordered.  Evaluation of Outcomes: Not Met   LCSW Treatment Plan for Primary Diagnosis: MDD (major depressive disorder) Long Term Goal(s): Safe transition to appropriate next level of care at discharge, Engage patient in therapeutic group addressing interpersonal concerns.  Short Term Goals: Engage patient in aftercare planning with referrals and resources  Therapeutic Interventions: Assess for all discharge needs, 1 to 1 time with Social worker, Explore available resources and support systems, Assess for adequacy in community support network, Educate family and significant other(s) on suicide prevention, Complete Psychosocial Assessment, Interpersonal group therapy.  Evaluation of Outcomes: Not Met   Progress in Treatment: Attending groups: No. Participating in groups: No. Taking medication as prescribed: Yes. Toleration medication: Yes. Family/Significant other contact made: No, will contact:  will contact when pt gives consent Patient understands diagnosis: Yes. Discussing patient identified problems/goals with staff: Yes. Medical problems stabilized or resolved: No. Denies suicidal/homicidal ideation: No. Issues/concerns per patient self-inventory: No. Other: NA  New problem(s) identified: No, Describe:  none reported  New Short Term/Long Term Goal(s):Attend outpatient treatment, take  medication as prescribed, develop and implement healthy coping methods.  Patient Goals:  "Get the help I need"  Discharge Plan or  Barriers: Pt will return home and follow up with outpatient treatment  Reason for Continuation of Hospitalization: Depression Medication stabilization Suicidal ideation  Estimated Length of Stay:1-7 days  Attendees: Patient:Tina Hale 12/23/2018 9:21 AM  Physician:  12/23/2018 9:21 AM  Nursing: Harriett Sine 12/23/2018 9:21 AM  RN Care Manager: 12/23/2018 9:21 AM  Social Worker: Sanjuana Kava 12/23/2018 9:21 AM  Recreational Therapist:  12/23/2018 9:21 AM  Other:  12/23/2018 9:21 AM  Other:  12/23/2018 9:21 AM  Other: 12/23/2018 9:21 AM    Scribe for Treatment Team: Yvette Rack, LCSW 12/23/2018 9:21 AM

## 2018-12-23 NOTE — [facility] Group Notes (Signed)
Occupational Therapy Group Note  Date:  12/23/2018 Time:  3:35 PM  Group Topic/Focus:  Self Esteem Action Plan:   The focus of this group is to help patients create a plan to continue to build self-esteem after discharge.  Participation Level:  Active  Participation Quality:  Attentive  Affect:  Blunted and Depressed  Cognitive:  Alert  Insight: Improving  Engagement in Group:  Engaged  Modes of Intervention:  Activity, Discussion, Education and Socialization  Additional Comments:    S: My self esteem is super high  O: OT tx with focus on self esteem building this date. Education given on definition of self esteem, with both causes of low and high self esteem identified. Activity given for pt to identify a positive/aspiring trait for each letter of the alphabet. Pt to work with peers to help complete activity and build positive thinking.   A: Pt presents with poor insight, stating that her self esteem is high. She then begins to explain how her self esteem is very low. Pt completed A-Z worksheet and shared with group members.  P: OT group will be x1 per week while pt inpatient  Zenovia Jarred, MSOT, OTR/L [place] Office: [place] 12/23/2018, 3:35 PM
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
## 2018-12-23 NOTE — Progress Notes (Signed)
Patient was offered by the writer to have her dinner brought to her. She could have gone to dining room in her wheelchair, but patient refused, walking to dining room by herself. The pt felt dizzy, sat on the floor and proceeded to lay down. Vitals were taken by the nurse.
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
## 2018-12-23 NOTE — [facility] Suicide Risk Assessment (Signed)
[place] INPATIENT:  Family/Significant Other Suicide Prevention Education  Suicide Prevention Education:  Contact Attempts: Shallen Trebing, mother [phone] has been identified by the patient as the family member/significant other with whom the patient will be residing, and identified as the person(s) who will aid the patient in the event of a mental health crisis.  With written consent from the patient, two attempts were made to provide suicide prevention education, prior to and/or following the patient's discharge.  We were unsuccessful in providing suicide prevention education.  A suicide education pamphlet was given to the patient to share with family/significant other.  Date and time of first attempt:12/23/18 1150am CSW left confidential vm, awaiting response Date and time of second attempt: 2nd attempt needed  [place] 12/23/2018, 11:51 AM

## 2018-12-23 NOTE — Progress Notes (Signed)
Patient ID: Tina Hale, female   DOB: 29-Jun-1995, 23 y.o.   MRN: [id number] Pt slid out of her chair into the floor in dayroom. Pt c/o pounding chest pain rated as 10 as of 0-10 scale. Vital signs, EKG performed and NP notified. No new orders given. Pt encouraged to rest with warm pack for back pain. Pt moved to different room with medical bed to adjust for comfort. Wheelchair provider for safety. Pt has been sitting in dayroom working on puzzle. Pt requested for snack which was provided. Will continue to assess for safety and stability.

## 2018-12-23 NOTE — Tx Team (Signed)
Initial Treatment Plan 12/23/2018 12:51 AM Tina Hale [id number]    PATIENT STRESSORS: Financial difficulties Health problems Medication change or noncompliance   PATIENT STRENGTHS: Ability for insight Average or above average intelligence Capable of independent living Motivation for treatment/growth Supportive family/friends   PATIENT IDENTIFIED PROBLEMS:   depression  anxiety  intentional overdose  Health problems- back pain  "finding help with what is wrong with me"           DISCHARGE CRITERIA:  Improved stabilization in mood, thinking, and/or behavior Medical problems require only outpatient monitoring Motivation to continue treatment in a less acute level of care Reduction of life-threatening or endangering symptoms to within safe limits Safe-care adequate arrangements made Verbal commitment to aftercare and medication compliance  PRELIMINARY DISCHARGE PLAN: Attend aftercare/continuing care group Outpatient therapy Return to previous living arrangement  PATIENT/FAMILY INVOLVEMENT: This treatment plan has been presented to and reviewed with the patient, Tina Hale,   The patient and family have been given the opportunity to ask questions and make suggestions.  JEHU-APPIAH, Megan Salon, RN 12/23/2018, 12:51 AM

## 2018-12-23 NOTE — [facility] Group Notes (Signed)
We had AA this evening. Pt attended [place] meeting this evening.

## 2018-12-23 NOTE — Progress Notes (Signed)
D:  Patient's self inventory sheet, patient has poor sleep, no sleep medication.  Fair appetite, low energy level, poor concentration.  Rated depression 10, anxiety, depression 7.   Denied  withdrawals.  Checked chilling, nausea.  SI, contracts for safety.  Physical problems, pain, back, and chest.  Physical pain, worst pain #10.  Goal is get needed help.  Take medications.  No discharge pain.   A:  Medications administered per MD orders.  Emotional support and encouragement given patient. R:  Denied SI and HI while talking to nurse today, contracts for safety.  Denied A/V hallucinations.  Safety maintained with 15 minute checks.
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
## 2018-12-24 MED ORDER — LIDOCAINE 5 % EX PTCH
1.0000 | MEDICATED_PATCH | CUTANEOUS | Status: DC
  Administered 2018-12-24 – 2018-12-28 (×5): 1 via TRANSDERMAL
  Filled 2018-12-24 (×2): qty 1
  Filled 2018-12-24: qty 7
  Filled 2018-12-24 (×2): qty 1
  Filled 2018-12-24: qty 7
  Filled 2018-12-24 (×2): qty 1

## 2018-12-24 MED ORDER — DULOXETINE HCL 20 MG PO CPEP
40.0000 mg | ORAL_CAPSULE | Freq: Every day | ORAL | Status: DC
  Administered 2018-12-25 – 2018-12-28 (×4): 40 mg via ORAL
  Filled 2018-12-24 (×5): qty 2

## 2018-12-24 NOTE — Progress Notes (Signed)
[facility] MD Progress Note  12/24/2018 3:20 PM Tina Hale  MRN:  [id number] Subjective: Patient reports feeling "the same".  Describes persistent/chronic pain.  Today describes pain is mostly affecting her upper back, not chest today.  States that current medications "helped but only a little bit".  Denies suicidal ideations at this time. Objective: I have reviewed case with treatment team and met with patient. 23 year old female, lives with sister.  Presented to ED reporting worsening depression, suicidal thoughts and recent overdose on Motrin, Naprosyn, Tylenol, Advil, aspirin.  Reports she talks about 2 of each in the evening and then 2 more beach in the morning.  States that intention was to relieve pain but also acknowledges recent SI as well as neurovegetative symptoms of depression.  History of prior psychiatric admission in 2019 for overdose.  Prior history of PTSD diagnosis. Reports chronic pain times several months affecting chest and back mainly.  Reports her mood is "the same".  Continues to focus on pain.  Affect does present with some improvement and more reactive.  Smiles at times appropriately.  Also seems overall to be more conversant/communicative and with improving eye contact. Currently presents comfortable and in no acute distress.  Mobilizes in wheelchair but at times noted to be walking in hallway. Currently does not endorse medication side effects.  She is currently on Cymbalta/Neurontin.  Tolerating well thus far.  Was also started on Protonix and Carafate based on history of frequent NSAID use and report of precordial/chest discomfort.  As noted today denies any chest pain, but continues to endorse back pain. No disruptive or agitated behaviors on unit At this time denies suicidal ideation and contracts for safety. Principal Problem: MDD (major depressive disorder) Diagnosis: Principal Problem:   MDD (major depressive disorder)  Total Time spent with patient: 20 minutes  Past  Psychiatric History:   Past Medical History:  Past Medical History:  Diagnosis Date  . ADD (attention deficit disorder)   . ADHD (attention deficit hyperactivity disorder)   . Anemia   . Anger   . Anxiety   . Asthma   . Depression   . Head trauma   . Headache   . Memory loss   . PTSD (post-traumatic stress disorder)    History reviewed. No pertinent surgical history. Family History:  Family History  Problem Relation Age of Onset  . High blood pressure Mother   . High blood pressure Father   . Cancer Maternal Grandmother    Family Psychiatric  History:  Social History:  Social History   Substance and Sexual Activity  Alcohol Use No  . Alcohol/week: 0.0 standard drinks     Social History   Substance and Sexual Activity  Drug Use No    Social History   Socioeconomic History  . Marital status: Single    Spouse name: Not on file  . Number of children: Not on file  . Years of education: Not on file  . Highest education level: Not on file  Occupational History  . Not on file  Tobacco Use  . Smoking status: Passive Smoke Exposure - Never Smoker  . Smokeless tobacco: Never Used  . Tobacco comment: Step dad smokes  Substance and Sexual Activity  . Alcohol use: No    Alcohol/week: 0.0 standard drinks  . Drug use: No  . Sexual activity: Not Currently    Birth control/protection: Implant    Comment: not at present  Other Topics Concern  . 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 She lives with her mother and siblings. She enjoys praise dance and singing in the choir.                ** Merged History Encounter **       Social Determinants of Health   Financial Resource Strain:   . Difficulty of Paying Living Expenses: Not on file  Food Insecurity:   . Worried About Charity fundraiser in the Last Year: Not on file  . Ran Out of Food in the Last Year: Not on file  Transportation Needs:    . Lack of Transportation (Medical): Not on file  . Lack of Transportation (Non-Medical): Not on file  Physical Activity:   . Days of Exercise per Week: Not on file  . Minutes of Exercise per Session: Not on file  Stress:   . Feeling of Stress : Not on file  Social Connections:   . Frequency of Communication with Friends and Family: Not on file  . Frequency of Social Gatherings with Friends and Family: Not on file  . Attends Religious Services: Not on file  . Active Member of Clubs or Organizations: Not on file  . Attends Archivist Meetings: Not on file  . Marital Status: Not on file   Additional Social History:   Sleep: Reports fair sleep with occasional nightmares  Appetite:  Improving  Current Medications: Current Facility-Administered Medications  Medication Dose Route Frequency Provider Last Rate Last Admin  . DULoxetine (CYMBALTA) DR capsule 20 mg  20 mg Oral Daily Parrish Daddario, Myer Peer, MD   20 mg at 12/24/18 0813  . gabapentin (NEURONTIN) capsule 100 mg  100 mg Oral TID Azia Toutant, Myer Peer, MD   100 mg at 12/24/18 1131  . LORazepam (ATIVAN) tablet 0.5 mg  0.5 mg Oral Q6H PRN Emry Barbato, Myer Peer, MD   0.5 mg at 12/23/18 2144  . metFORMIN (GLUCOPHAGE) tablet 500 mg  500 mg Oral Q breakfast Faviola Klare, Myer Peer, MD   500 mg at 12/24/18 0813  . pantoprazole (PROTONIX) EC tablet 40 mg  40 mg Oral Daily Antionette Luster, Myer Peer, MD   40 mg at 12/24/18 0813  . sucralfate (CARAFATE) tablet 1 g  1 g Oral TID WC & HS Mert Dietrick, Myer Peer, MD   1 g at 12/24/18 1131  . traMADol (ULTRAM) tablet 50 mg  50 mg Oral Q12H PRN Catheleen Langhorne, Myer Peer, MD   50 mg at 12/24/18 0093    Lab Results:  Results for orders placed or performed during the hospital encounter of 12/22/18 (from the past 48 hour(s))  Hemoglobin A1c     Status: None   Collection Time: 12/23/18  6:54 AM  Result Value Ref Range   Hgb A1c MFr Bld 5.5 4.8 - 5.6 %    Comment: (NOTE) Pre diabetes:          5.7%-6.4% Diabetes:               >6.4% Glycemic control for   <7.0% adults with diabetes    Mean Plasma Glucose 111.15 mg/dL    Comment: Performed at [facility], [place] [address], [place], [place] [address]  Lipid panel     Status: None   Collection Time: 12/23/18  6:54 AM  Result Value Ref Range   Cholesterol 95 0 - 200 mg/dL   Triglycerides 75 <150 mg/dL   HDL 43 >40 mg/dL  Total CHOL/HDL Ratio 2.2 RATIO   VLDL 15 0 - 40 mg/dL   LDL Cholesterol 37 0 - 99 mg/dL    Comment:        Total Cholesterol/HDL:CHD Risk Coronary Heart Disease Risk Table                     Men   Women  1/2 Average Risk   3.4   3.3  Average Risk       5.0   4.4  2 X Average Risk   9.6   7.1  3 X Average Risk  23.4   11.0        Use the calculated Patient Ratio above and the CHD Risk Table to determine the patient's CHD Risk.        ATP III CLASSIFICATION (LDL):  <100     mg/dL   Optimal  100-129  mg/dL   Near or Above                    Optimal  130-159  mg/dL   Borderline  160-189  mg/dL   High  >190     mg/dL   Very High Performed at [facility] [address] [place], [place] [address]   TSH     Status: None   Collection Time: 12/23/18  6:54 AM  Result Value Ref Range   TSH 2.888 0.350 - 4.500 uIU/mL    Comment: Performed by a 3rd Generation assay with a functional sensitivity of <=0.01 uIU/mL. Performed at [facility], [place] [address], [place], [place] [address]     Blood Alcohol level:  Lab Results  Component Value Date   [facility] <10 12/22/2018   ETH <10 62/26/3335    Metabolic Disorder Labs: Lab Results  Component Value Date   HGBA1C 5.5 12/23/2018   MPG 111.15 12/23/2018   MPG 102.54 10/30/2017   Lab Results  Component Value Date   PROLACTIN 25.6 (H) 10/30/2017   Lab Results  Component Value Date   CHOL 95 12/23/2018   TRIG 75 12/23/2018   HDL 43 12/23/2018   CHOLHDL 2.2 12/23/2018   VLDL 15 12/23/2018   LDLCALC 37 12/23/2018   LDLCALC 48 10/30/2017     Physical Findings: AIMS: Facial and Oral Movements Muscles of Facial Expression: None, normal Lips and Perioral Area: None, normal Jaw: None, normal Tongue: None, normal,Extremity Movements Upper (arms, wrists, hands, fingers): None, normal Lower (legs, knees, ankles, toes): None, normal, Trunk Movements Neck, shoulders, hips: None, normal, Overall Severity Severity of abnormal movements (highest score from questions above): None, normal Incapacitation due to abnormal movements: None, normal Patient's awareness of abnormal movements (rate only patient's report): No Awareness, Dental Status Current problems with teeth and/or dentures?: No Does patient usually wear dentures?: No  CIWA:  CIWA-Ar Total: 1 COWS:     Musculoskeletal: Strength & Muscle Tone: within normal limits Gait & Station: normal mobilizes in wheelchair at times, but is noted to be able to ambulate/walk at times without difficulty Patient leans: N/A  Psychiatric Specialty Exam: Physical Exam  Review of Systems describes chronic pain, today affecting mainly upper back.  Denies chest pain today.  No shortness of breath at room air.  No vomiting.  Blood pressure 108/67, pulse (!) 127, temperature 98.2 F (36.8 C), temperature source Oral, resp. rate 16, height 5' 2.99" (1.6 m), weight 114.3 kg, SpO2 100 %.Body mass index is 44.65 kg/m.  General Appearance:  Fairly Groomed  Engineer, water:  Improving  Speech:  Normal  Volume:  Soft  Mood:  Reports feeling "the same"  Affect:  Noted to be more reactive today, smiles briefly at times appropriately  Thought Process:  Linear and Descriptions of Associations: Intact  Orientation:  Other:  Fully alert and attentive  Thought Content:  Somatic preoccupations, no hallucinations, no delusions, not internally preoccupied  Suicidal Thoughts:  No currently denies suicidal or self-injurious ideations and contracts for safety on unit at this time  Homicidal Thoughts:  No  Memory:   Recent and remote grossly intact  Judgement:  Fair/improving  Insight:  Fair  Psychomotor Activity:  Normal-no psychomotor agitation or restlessness noted or reported  Concentration:  Concentration: Good and Attention Span: Good  Recall:  Good  Fund of Knowledge:  Good  Language:  Good  Akathisia:  Negative  Handed:  Right  AIMS (if indicated):     Assets:  Communication Skills Desire for Improvement Resilience  ADL's:  Intact  Cognition:  WNL  Sleep:  Number of Hours: 6   Assessment-  23 year old female, lives with sister.  Presented to ED reporting worsening depression, suicidal thoughts and recent overdose on Motrin, Naprosyn, Tylenol, Advil, aspirin.  Reports she talks about 2 of each in the evening and then 2 more beach in the morning.  States that intention was to relieve pain but also acknowledges recent SI as well as neurovegetative symptoms of depression.  History of prior psychiatric admission in 2019 for overdose.  Prior history of PTSD diagnosis. Reports chronic pain times several months affecting chest and back mainly.   Patient is currently describing some persistent depression which she attributes to chronic pain.  Today denies suicidal ideations and contracts for safety.  Affect does seem partially improved today/becoming more reactive.  Does not endorse medication side effects.  Continues to focus on pain, but today described mainly upper back pain rather than chest pain.  Appears comfortable and does not appear to be in any acute distress.  Treatment Plan Summary: Daily contact with patient to assess and evaluate symptoms and progress in treatment, Medication management, Plan Inpatient treatment and Medications as below Encourage group and milieu participation Continue Neurontin 800 mg 3 times daily for pain and anxiety Increase Cymbalta to 40 mg daily for depression, pain Start Lidoderm patch to affected area of back for analgesia/back pain.  Patient denies any  allergy to lidocaine. Continue Ultram 50 mg every 12 hours as needed for pain Continue Protonix and Carafate to address potential GERD symptoms Continue Ativan 0.5 mg every 6 hours as needed for anxiety as needed Treatment team working on disposition planning options Jenne Campus, MD 12/24/2018, 3:20 PM

## 2018-12-24 NOTE — Progress Notes (Signed)
DAR NOTE: Patient presents with anxious affect and mood.  Denies suicidal thoughts, auditory and visual hallucinations.  Reports a lots of somatic symptoms such as dizziness, headaches, lightheaded, pain, fatigue, tiredness, agitation, nausea and anhedonia.  Described energy level as low and concentration as poor.  Rates depression at 10, hopelessness at 7, and anxiety at 8.  Maintained on routine safety checks.  Medications given as prescribed.  Support and encouragement offered as needed.  Attended group and participated.  States goal for today is "my depression."  Patient observed socializing with peers in the dayroom.  Ambulatory on the unit with wheel chair self propelling.  Patient is safe on and off the unit.

## 2018-12-24 NOTE — Progress Notes (Signed)
   12/23/18 2232  Psych Admission Type (Psych Patients Only)  Admission Status Voluntary  Psychosocial Assessment  Patient Complaints Anxiety  Eye Contact Fair  Facial Expression Pained  Affect Depressed  Speech Logical/coherent  Interaction Assertive  Motor Activity Restless  Appearance/Hygiene Unremarkable  Behavior Characteristics Cooperative  Mood Anxious  Thought Process  Coherency WDL  Content WDL  Delusions None reported or observed  Perception WDL  Hallucination None reported or observed  Judgment Limited  Confusion None  Danger to Self  Current suicidal ideation? Denies (while in hospital)  Danger to Others  Danger to Others None reported or observed

## 2018-12-24 NOTE — Progress Notes (Signed)
[facility] Group Notes:  (Nursing/MHT/Case Management/Adjunct)  Date:  12/24/2018  Time:  2030  Type of Therapy:  wrap up group  Participation Level:  Active  Participation Quality:  Appropriate, Attentive, Sharing and Supportive  Affect:  Appropriate  Cognitive:  Appropriate  Insight:  Improving  Engagement in Group:  Engaged  Modes of Intervention:  Clarification, Education and Support  Summary of Progress/Problems: Pt shared that she had a nice visit with her sister. Pt plans on bonding more with her sister and not allowing other people to make decisions about her life. Pt is grateful for her family.   Winfield Rast S 12/24/2018, 9:18 PM

## 2018-12-24 NOTE — Progress Notes (Signed)
   12/24/18 0900  Psych Admission Type (Psych Patients Only)  Admission Status Voluntary  Psychosocial Assessment  Patient Complaints Depression  Eye Contact Fair  Facial Expression Pained  Affect Depressed  Speech Logical/coherent  Interaction Assertive  Motor Activity Restless  Appearance/Hygiene Unremarkable  Behavior Characteristics Cooperative  Mood Preoccupied  Thought Process  Coherency WDL  Content WDL  Delusions None reported or observed  Perception WDL  Hallucination None reported or observed  Judgment Limited  Confusion None  Danger to Self  Current suicidal ideation? Denies (while in hospital)  Danger to Others  Danger to Others None reported or observed  D: Patient in dayroom c/o lower back pain. Pt working on puzzle and interacting with peers and staff. A: Medications administered as prescribed. Support and encouragement provided as needed.  R: Patient remains safe on the unit. Will continue to monitor for safety and stability.

## 2018-12-25 MED ORDER — GABAPENTIN 300 MG PO CAPS
300.0000 mg | ORAL_CAPSULE | Freq: Three times a day (TID) | ORAL | Status: DC
  Administered 2018-12-25 – 2018-12-29 (×11): 300 mg via ORAL
  Filled 2018-12-25: qty 21
  Filled 2018-12-25: qty 1
  Filled 2018-12-25: qty 21
  Filled 2018-12-25 (×3): qty 1
  Filled 2018-12-25: qty 21
  Filled 2018-12-25 (×2): qty 1
  Filled 2018-12-25 (×2): qty 21
  Filled 2018-12-25 (×3): qty 1
  Filled 2018-12-25: qty 21
  Filled 2018-12-25 (×2): qty 1

## 2018-12-25 MED ORDER — POLYETHYLENE GLYCOL 3350 17 G PO PACK: 17.0000 g | PACK | Freq: Every day | ORAL | Status: DC | PRN

## 2018-12-25 MED ORDER — TRAMADOL HCL 50 MG PO TABS
25.0000 mg | ORAL_TABLET | Freq: Three times a day (TID) | ORAL | Status: DC | PRN
  Administered 2018-12-25 – 2018-12-26 (×2): 25 mg via ORAL
  Filled 2018-12-25 (×2): qty 1

## 2018-12-25 NOTE — Progress Notes (Signed)
Medications administered per MD orders.  Emotional support and encouragement given patient.  Safety maintained with 15 minute checks.

## 2018-12-25 NOTE — [facility] Group Notes (Signed)
12/25/2018 8:45am Type of Group and Topic: Psychoeducational Group: Discharge Planning  Participation Level: Active  Description of Group Discharge planning group reviews patient's anticipated discharge plans and assists patients to anticipate and address any barriers to wellness/recovery in the community. Suicide prevention education is reviewed with patients in group. Therapeutic Goals 1. Patients will state their anticipated discharge plan and mental health aftercare 2. Patients will identify potential barriers to wellness in the community setting 3. Patients will engage in problem solving, solution focused discussion of ways to anticipate and address barriers to wellness/recovery   Summary of Patient Progress Plan for Discharge/Comments:  Alzenia plans to return home with her family at discharge. She reports she would like information on long-term residential programs at discharge for continuity of care. Biatriz states that she does not have any other concerns at this time.   Transportation Means:  Patient's mother will pick her up   Supports:  Family   Therapeutic Modalities: [place], MSW, [place] Worker [facility]  Phone: [phone] 12/25/2018 1:49 PM

## 2018-12-25 NOTE — [facility] Group Notes (Signed)
Adult Psychoeducational Group Note  Date:  12/25/2018 Time:  9:47 PM  Group Topic/Focus:  Wrap-Up Group:   The focus of this group is to help patients review their daily goal of treatment and discuss progress on daily workbooks.  Participation Level:  Active  Participation Quality:  Appropriate  Affect:  Appropriate  Cognitive:  Appropriate  Insight: Appropriate  Engagement in Group:  Engaged  Modes of Intervention:  Discussion  Additional Comments:  Patient attended and participated in the wrap-up group.  Annie Sable 12/25/2018, 9:47 PM

## 2018-12-25 NOTE — Progress Notes (Addendum)
[facility] MD Progress Note  12/25/2018 3:41 PM Selyna Klahn  MRN:  [id number] Subjective: Patient describes partially improved mood, but reports still feeling depressed .Continues to present with multiple somatic concerns- describes frequent headaches, intolerance to bright lights, back pain affecting both lower and upper back, and general pain on touching . States " it doesn't matter where I touch, it hurts".  Denies medication side effects. Denies suicidal ideations at present. Does not endorse medication side effects. Objective: I have reviewed case with treatment team and met with patient. 23 year old female, lives with sister.  Presented to ED reporting worsening depression, suicidal thoughts and recent overdose on Motrin, Naprosyn, Tylenol, Advil, aspirin.  Reports she talks about 2 of each in the evening and then 2 more beach in the morning.  States that intention was to relieve pain but also acknowledges recent SI as well as neurovegetative symptoms of depression.  History of prior psychiatric admission in 2019 for overdose.  Prior history of PTSD diagnosis.  Patient presents with partially improving range of affect. Eye contact, speech noted to be improving . She does remain vaguely depressed/constricted and still focused on somatic concerns as above . Currently presents comfortable and oes not appear to be in any acute distress. Mobilizes in wheel chair, which she attributes to pain, but at times noted to be walking steadily/without difficulty .  She is  tolerating medications well , denies side effects. Has been spending more time in day room and has been interacting with peers . No disruptive or agitated behaviors on unit .    Principal Problem: MDD (major depressive disorder) Diagnosis: Principal Problem:   MDD (major depressive disorder)  Total Time spent with patient: 20 minutes  Past Psychiatric History:   Past Medical History:  Past Medical History:  Diagnosis Date  . ADD  (attention deficit disorder)   . ADHD (attention deficit hyperactivity disorder)   . Anemia   . Anger   . Anxiety   . Asthma   . Depression   . Head trauma   . Headache   . Memory loss   . PTSD (post-traumatic stress disorder)    History reviewed. No pertinent surgical history. Family History:  Family History  Problem Relation Age of Onset  . High blood pressure Mother   . High blood pressure Father   . Cancer Maternal Grandmother    Family Psychiatric  History:  Social History:  Social History   Substance and Sexual Activity  Alcohol Use No  . Alcohol/week: 0.0 standard drinks     Social History   Substance and Sexual Activity  Drug Use No    Social History   Socioeconomic History  . Marital status: Single    Spouse name: Not on file  . Number of children: Not on file  . Years of education: Not on file  . Highest education level: Not on file  Occupational History  . Not on file  Tobacco Use  . Smoking status: Passive Smoke Exposure - Never Smoker  . Smokeless tobacco: Never Used  . Tobacco comment: Step dad smokes  Substance and Sexual Activity  . Alcohol use: No    Alcohol/week: 0.0 standard drinks  . Drug use: No  . Sexual activity: Not Currently    Birth control/protection: Implant    Comment: not at present  Other Topics Concern  . 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 She lives  with her mother and siblings. She enjoys praise dance and singing in the choir.                ** Merged History Encounter **       Social Determinants of Health   Financial Resource Strain:   . Difficulty of Paying Living Expenses: Not on file  Food Insecurity:   . Worried About Charity fundraiser in the Last Year: Not on file  . Ran Out of Food in the Last Year: Not on file  Transportation Needs:   . Lack of Transportation (Medical): Not on file  . Lack of Transportation (Non-Medical): Not  on file  Physical Activity:   . Days of Exercise per Week: Not on file  . Minutes of Exercise per Session: Not on file  Stress:   . Feeling of Stress : Not on file  Social Connections:   . Frequency of Communication with Friends and Family: Not on file  . Frequency of Social Gatherings with Friends and Family: Not on file  . Attends Religious Services: Not on file  . Active Member of Clubs or Organizations: Not on file  . Attends Archivist Meetings: Not on file  . Marital Status: Not on file   Additional Social History:   Sleep: improving   Appetite:  Improving  Current Medications: Current Facility-Administered Medications  Medication Dose Route Frequency Provider Last Rate Last Admin  . DULoxetine (CYMBALTA) DR capsule 40 mg  40 mg Oral Daily Marlin Brys, Myer Peer, MD   40 mg at 12/25/18 0740  . gabapentin (NEURONTIN) capsule 300 mg  300 mg Oral TID Cecil Bixby A, MD      . lidocaine (LIDODERM) 5 % 1 patch  1 patch Transdermal Q24H Jadyn Brasher, Myer Peer, MD   1 patch at 12/24/18 1718  . LORazepam (ATIVAN) tablet 0.5 mg  0.5 mg Oral Q6H PRN Breon Rehm, Myer Peer, MD   0.5 mg at 12/23/18 2144  . metFORMIN (GLUCOPHAGE) tablet 500 mg  500 mg Oral Q breakfast Shenae Bonanno, Myer Peer, MD   500 mg at 12/25/18 0740  . pantoprazole (PROTONIX) EC tablet 40 mg  40 mg Oral Daily Laurann Mcmorris, Myer Peer, MD   40 mg at 12/25/18 0740  . polyethylene glycol (MIRALAX / GLYCOLAX) packet 17 g  17 g Oral Daily PRN Doni Widmer A, MD      . sucralfate (CARAFATE) tablet 1 g  1 g Oral TID WC & HS Gleen Ripberger, Myer Peer, MD   1 g at 12/25/18 1221  . traMADol (ULTRAM) tablet 50 mg  50 mg Oral Q12H PRN Shaylie Eklund, Myer Peer, MD   50 mg at 12/25/18 1224    Lab Results:  No results found for this or any previous visit (from the past 48 hour(s)).  Blood Alcohol level:  Lab Results  Component Value Date   ETH <10 12/22/2018   ETH <10 84/16/6063    Metabolic Disorder Labs: Lab Results  Component Value Date    HGBA1C 5.5 12/23/2018   MPG 111.15 12/23/2018   MPG 102.54 10/30/2017   Lab Results  Component Value Date   PROLACTIN 25.6 (H) 10/30/2017   Lab Results  Component Value Date   CHOL 95 12/23/2018   TRIG 75 12/23/2018   HDL 43 12/23/2018   CHOLHDL 2.2 12/23/2018   VLDL 15 12/23/2018   LDLCALC 37 12/23/2018   LDLCALC 48 10/30/2017    Physical Findings: AIMS: Facial and Oral Movements Muscles of Facial Expression:  None, normal Lips and Perioral Area: None, normal Jaw: None, normal Tongue: None, normal,Extremity Movements Upper (arms, wrists, hands, fingers): None, normal Lower (legs, knees, ankles, toes): None, normal, Trunk Movements Neck, shoulders, hips: None, normal, Overall Severity Severity of abnormal movements (highest score from questions above): None, normal Incapacitation due to abnormal movements: None, normal Patient's awareness of abnormal movements (rate only patient's report): No Awareness, Dental Status Current problems with teeth and/or dentures?: No Does patient usually wear dentures?: No  CIWA:  CIWA-Ar Total: 1 COWS:     Musculoskeletal: Strength & Muscle Tone: within normal limits Gait & Station: normal mobilizes in wheelchair at times, but is noted to be able to ambulate/walk at times without difficulty Patient leans: N/A  Psychiatric Specialty Exam: Physical Exam  Review of Systems reports headaches, intolerance to bright lights, back pain, generalized pains . Reports constipation  Blood pressure 98/78, pulse (!) 125, temperature 97.7 F (36.5 C), resp. rate 16, height 5' 2.99" (1.6 m), weight 114.3 kg, SpO2 100 %.Body mass index is 44.65 kg/m.  General Appearance: Fairly Groomed  Eye Contact:  Improving  Speech:  Normal  Volume:  Normal  Mood:  partially improved mood   Affect:  less constricted, smiles briefly at times during session  Thought Process:  Linear and Descriptions of Associations: Intact  Orientation:  Other:  Fully alert and  attentive  Thought Content:  no hallucinations, no delusions , not internally preoccupied, somatic preoccupations   Suicidal Thoughts:  No currently denies suicidal or self-injurious ideations and contracts for safety on unit at this time  Homicidal Thoughts:  No  Memory:  Recent and remote grossly intact  Judgement:  Fair/improving  Insight:  Fair  Psychomotor Activity:  Normal-no psychomotor agitation or restlessness noted or reported  Concentration:  Concentration: Good and Attention Span: Good  Recall:  Good  Fund of Knowledge:  Good  Language:  Good  Akathisia:  Negative  Handed:  Right  AIMS (if indicated):     Assets:  Communication Skills Desire for Improvement Resilience  ADL's:  Intact  Cognition:  WNL  Sleep:  Number of Hours: 6   Assessment-  23 year old female, lives with sister.  Presented to ED reporting worsening depression, suicidal thoughts and recent overdose on Motrin, Naprosyn, Tylenol, Advil, aspirin.  Reports she talks about 2 of each in the evening and then 2 more beach in the morning.  States that intention was to relieve pain but also acknowledges recent SI as well as neurovegetative symptoms of depression.  History of prior psychiatric admission in 2019 for overdose.  Prior history of PTSD diagnosis. Reports chronic pain times several months affecting chest and back mainly.   Patient is presenting with partially improving mood and less constricted affect. Noted to be spending more time in day room, more interactive . Remains somatically focused and describing several pain symptoms. Appears comfortable and in no acute distress . Tolerating Cymbalta and Neurontin well thus far . Also reports lidoderm patch has helped partially. .  Treatment Plan Summary: Daily contact with patient to assess and evaluate symptoms and progress in treatment, Medication management, Plan Inpatient treatment and Medications as below  Treatment Plan reviewed as below today  12/11 Encourage group and milieu participation Increase Neurontin to 300 mg 3 times daily for pain and anxiety Continue Cymbalta  40 mg daily for depression, pain Continue Lidoderm patch to affected area of back for analgesia/back pain. Start tapering Ultram to 25  mg every 8 hours as needed for  pain Continue Protonix and Carafate to address potential GERD symptoms Continue Ativan 0.5 mg every 6 hours as needed for anxiety as needed Start Miralax/Glycolax for constipation as needed. Treatment team working on disposition planning options Jenne Campus, MD 12/25/2018, 3:41 PM    Patient ID: Sima Matas, female   DOB: 27-Jun-1995, 23 y.o.   MRN: [id number]

## 2018-12-25 NOTE — Progress Notes (Signed)
Recreation Therapy Notes  Date:  12.11.20 Time: 0930 Location: [address] Dayroom  Group Topic: Stress Management  Goal Area(s) Addresses:  Patient will identify positive stress management techniques. Patient will identify benefits of using stress management post d/c.  Intervention: Stress Management  Activity :  Meditation.  LRT played a meditation that focused on choices.  Patients were to follow along and listen as meditation played to fully engage in activity.  Education:  Stress Management, Discharge Planning.   Education Outcome: Acknowledges Education  Clinical Observations/Feedback: Pt did not attend group activity.    Victorino Sparrow, LRT/CTRS         Ria Comment, Artez Regis A 12/25/2018 11:10 AM

## 2018-12-26 MED ORDER — TRAMADOL HCL 50 MG PO TABS
25.0000 mg | ORAL_TABLET | Freq: Two times a day (BID) | ORAL | Status: DC | PRN
  Administered 2018-12-26 – 2018-12-28 (×3): 25 mg via ORAL
  Filled 2018-12-26 (×3): qty 1

## 2018-12-26 NOTE — Progress Notes (Signed)
HH Group Notes:  (Nursing/MHT/Case Management/Adjunct)  Date:  12/26/2018  Time:  11:00 am  Type of Therapy:  Nurse Education Goals for Hospitalization   Participation Level:  Active  Participation Quality:  Appropriate  Affect:  Flat   Cognitive:  Appropriate  Insight:  Appropriate and Good  Engagement in Group:  Distracting  Modes of Intervention:  Discussion  Summary of Progress/Problems: Pt expressed not having a support system and her frustration towards her father. The pt had a difficult time staying on topic.

## 2018-12-26 NOTE — Progress Notes (Signed)
[facility] MD Progress Note  12/26/2018 12:20 PM Tina Hale  MRN:  [id number] Subjective:  Patient  acknowledges some improvement in mood.  States she has been able to enjoy spending time with peers in dayroom.  Denies medication side effects. Continues to report somatic/pain concerns.  Today reports that her wrist is hurting and is using ice pack locally with some improvement. ( Does not endorse any trauma to wrist). At this time does not endorse chest pain/ does report chronic back pain.  Objective: I have reviewed chart notes and met with patient. 23 year old female, lives with sister.  Presented to ED reporting worsening depression, suicidal thoughts and recent overdose on Motrin, Naprosyn, Tylenol, Advil, aspirin.  Reports she talks about 2 of each in the evening and then 2 more beach in the morning.  States that intention was to relieve pain but also acknowledges recent SI as well as neurovegetative symptoms of depression.  History of prior psychiatric admission in 2019 for overdose.  Prior history of PTSD diagnosis.  Patient presents alert, attentive, calm, polite on approach.  Mood has improved partially/gradually and affect is becoming more reactive.  Smiles briefly at times appropriately during session.  She is visible on unit and interactive with other peers about her age.  No disruptive or agitated behaviors on unit. Continues to present with somatic concerns/preoccupations.  Today describes right wrist pain.  However she seems less frequently/intensely focused on pain issues and today is not endorsing chest pain.  She appears comfortable and in no acute distress. Denies medication side effects. Denies suicidal ideations.   Principal Problem: MDD (major depressive disorder) Diagnosis: Principal Problem:   MDD (major depressive disorder)  Total Time spent with patient: 20 minutes  Past Psychiatric History:   Past Medical History:  Past Medical History:  Diagnosis Date  . ADD  (attention deficit disorder)   . ADHD (attention deficit hyperactivity disorder)   . Anemia   . Anger   . Anxiety   . Asthma   . Depression   . Head trauma   . Headache   . Memory loss   . PTSD (post-traumatic stress disorder)    History reviewed. No pertinent surgical history. Family History:  Family History  Problem Relation Age of Onset  . High blood pressure Mother   . High blood pressure Father   . Cancer Maternal Grandmother    Family Psychiatric  History:  Social History:  Social History   Substance and Sexual Activity  Alcohol Use No  . Alcohol/week: 0.0 standard drinks     Social History   Substance and Sexual Activity  Drug Use No    Social History   Socioeconomic History  . Marital status: Single    Spouse name: Not on file  . Number of children: Not on file  . Years of education: Not on file  . Highest education level: Not on file  Occupational History  . Not on file  Tobacco Use  . Smoking status: Passive Smoke Exposure - Never Smoker  . Smokeless tobacco: Never Used  . Tobacco comment: Step dad smokes  Substance and Sexual Activity  . Alcohol use: No    Alcohol/week: 0.0 standard drinks  . Drug use: No  . Sexual activity: Not Currently    Birth control/protection: Implant    Comment: not at present  Other Topics Concern  . 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  She lives with her mother and siblings. She enjoys praise dance and singing in the choir.                ** Merged History Encounter **       Social Determinants of Health   Financial Resource Strain:   . Difficulty of Paying Living Expenses: Not on file  Food Insecurity:   . Worried About Charity fundraiser in the Last Year: Not on file  . Ran Out of Food in the Last Year: Not on file  Transportation Needs:   . Lack of Transportation (Medical): Not on file  . Lack of Transportation (Non-Medical): Not  on file  Physical Activity:   . Days of Exercise per Week: Not on file  . Minutes of Exercise per Session: Not on file  Stress:   . Feeling of Stress : Not on file  Social Connections:   . Frequency of Communication with Friends and Family: Not on file  . Frequency of Social Gatherings with Friends and Family: Not on file  . Attends Religious Services: Not on file  . Active Member of Clubs or Organizations: Not on file  . Attends Archivist Meetings: Not on file  . Marital Status: Not on file   Additional Social History:   Sleep: improving   Appetite:  Improving  Current Medications: Current Facility-Administered Medications  Medication Dose Route Frequency Provider Last Rate Last Admin  . DULoxetine (CYMBALTA) DR capsule 40 mg  40 mg Oral Daily Arlen Legendre, Myer Peer, MD   40 mg at 12/26/18 0814  . gabapentin (NEURONTIN) capsule 300 mg  300 mg Oral TID Jayline Kilburg, Myer Peer, MD   300 mg at 12/26/18 0814  . lidocaine (LIDODERM) 5 % 1 patch  1 patch Transdermal Q24H Taejah Ohalloran, Myer Peer, MD   1 patch at 12/25/18 1553  . LORazepam (ATIVAN) tablet 0.5 mg  0.5 mg Oral Q6H PRN Lawrence Roldan, Myer Peer, MD   0.5 mg at 12/23/18 2144  . metFORMIN (GLUCOPHAGE) tablet 500 mg  500 mg Oral Q breakfast Jacia Sickman, Myer Peer, MD   500 mg at 12/26/18 0814  . pantoprazole (PROTONIX) EC tablet 40 mg  40 mg Oral Daily Tomicka Lover, Myer Peer, MD   40 mg at 12/26/18 0814  . polyethylene glycol (MIRALAX / GLYCOLAX) packet 17 g  17 g Oral Daily PRN Ariele Vidrio A, MD      . sucralfate (CARAFATE) tablet 1 g  1 g Oral TID WC & HS Adrick Kestler, Myer Peer, MD   1 g at 12/26/18 0814  . traMADol (ULTRAM) tablet 25 mg  25 mg Oral Q8H PRN Iseah Plouff, Myer Peer, MD   25 mg at 12/26/18 1131    Lab Results:  No results found for this or any previous visit (from the past 48 hour(s)).  Blood Alcohol level:  Lab Results  Component Value Date   ETH <10 12/22/2018   ETH <10 19/62/2297    Metabolic Disorder Labs: Lab Results   Component Value Date   HGBA1C 5.5 12/23/2018   MPG 111.15 12/23/2018   MPG 102.54 10/30/2017   Lab Results  Component Value Date   PROLACTIN 25.6 (H) 10/30/2017   Lab Results  Component Value Date   CHOL 95 12/23/2018   TRIG 75 12/23/2018   HDL 43 12/23/2018   CHOLHDL 2.2 12/23/2018   VLDL 15 12/23/2018   LDLCALC 37 12/23/2018   LDLCALC 48 10/30/2017    Physical Findings: AIMS: Facial and Oral  Movements Muscles of Facial Expression: None, normal Lips and Perioral Area: None, normal Jaw: None, normal Tongue: None, normal,Extremity Movements Upper (arms, wrists, hands, fingers): None, normal Lower (legs, knees, ankles, toes): None, normal, Trunk Movements Neck, shoulders, hips: None, normal, Overall Severity Severity of abnormal movements (highest score from questions above): None, normal Incapacitation due to abnormal movements: None, normal Patient's awareness of abnormal movements (rate only patient's report): No Awareness, Dental Status Current problems with teeth and/or dentures?: No Does patient usually wear dentures?: No  CIWA:  CIWA-Ar Total: 1 COWS:     Musculoskeletal: Strength & Muscle Tone: within normal limits Gait & Station: normal mobilizes in wheelchair at times, but is noted to be able to ambulate/walk at times without difficulty Patient leans: N/A  Psychiatric Specialty Exam: Physical Exam  Review of Systems reports wrist pain.  Reports constipation has resolved and had a bowel movement yesterday.  Denies vomiting.  Blood pressure 111/67, pulse (!) 122, temperature 98.2 F (36.8 C), temperature source Oral, resp. rate 16, height 5' 2.99" (1.6 m), weight 114.3 kg, SpO2 100 %.Body mass index is 44.65 kg/m.  General Appearance: Improving grooming  Eye Contact:  Improving  Speech:  Normal  Volume:  Normal  Mood:  Improving mood  Affect:  Gradually becoming more reactive  Thought Process:  Linear and Descriptions of Associations: Intact   Orientation:  Other:  Fully alert and attentive  Thought Content:  No psychotic symptoms, continues to endorse somatic concerns but seems less intensely focused on these issues at this time  Suicidal Thoughts:  No currently denies suicidal or self-injurious ideations and contracts for safety on unit at this time  Homicidal Thoughts:  No  Memory:  Recent and remote grossly intact  Judgement:  improving  Insight:  Fair  Psychomotor Activity:  Normal-no psychomotor agitation or restlessness noted or reported  Concentration:  Concentration: Good and Attention Span: Good  Recall:  Good  Fund of Knowledge:  Good  Language:  Good  Akathisia:  Negative  Handed:  Right  AIMS (if indicated):     Assets:  Communication Skills Desire for Improvement Resilience  ADL's:  Intact  Cognition:  WNL  Sleep:  Number of Hours: 6   Assessment-  23 year old female, lives with sister.  Presented to ED reporting worsening depression, suicidal thoughts and recent overdose on Motrin, Naprosyn, Tylenol, Advil, aspirin.  Reports she talks about 2 of each in the evening and then 2 more beach in the morning.  States that intention was to relieve pain but also acknowledges recent SI as well as neurovegetative symptoms of depression.  History of prior psychiatric admission in 2019 for overdose.  Prior history of PTSD diagnosis. Reports chronic pain times several months affecting chest and back mainly.   Patient is currently presenting with a gradually improving mood and range of affect.  Somatic preoccupations becoming less intense although still describing chronic pain and today reporting wrist pain.  Appears comfortable and in no acute distress.  Visible on unit and noted to be interacting appropriately with peers.  Thus far tolerating medications well. .  Treatment Plan Summary: Daily contact with patient to assess and evaluate symptoms and progress in treatment, Medication management, Plan Inpatient treatment and  Medications as below  Treatment Plan reviewed as below today 12/12 Encourage group and milieu participation Continue Neurontin  300 mg 3 times daily for pain and anxiety Continue Cymbalta  40 mg daily for depression, pain Continue Lidoderm patch to affected area of back for  analgesia/back pain. Continue tapering Ultram to 25  mg every 12 hours as needed for pain Continue Protonix and Carafate to address potential GERD symptoms Continue Ativan 0.5 mg every 6 hours as needed for anxiety as needed Continue Miralax/Glycolax for constipation as needed. Treatment team working on disposition planning options Tina Campus, MD 12/26/2018, 12:20 PM    Patient ID: Sima Matas, female   DOB: [birth year], 23 y.o.   MRN: [id number]

## 2018-12-26 NOTE — Progress Notes (Signed)
Pt c/o of difficulty sleeping last night. Pt denied having racing thoughts or nightmares. Pt denied SI/HI. Pt reported ongoing depression and rated it 7/10.     12/26/18 0800  Psych Admission Type (Psych Patients Only)  Admission Status Voluntary  Psychosocial Assessment  Patient Complaints Depression;Sleep disturbance  Eye Contact Fair  Facial Expression Flat  Affect Depressed  Speech Logical/coherent  Interaction Forwards little;Minimal  Motor Activity Restless  Appearance/Hygiene Unremarkable  Behavior Characteristics Appropriate to situation  Mood Depressed;Preoccupied  Thought Process  Coherency Concrete thinking  Content WDL  Delusions None reported or observed  Perception WDL  Hallucination None reported or observed  Judgment Limited  Confusion None  Danger to Self  Current suicidal ideation? Denies  Danger to Others  Danger to Others None reported or observed

## 2018-12-26 NOTE — [facility] Group Notes (Signed)
Adult Psychoeducational Group Note  Date:  12/26/2018 Time:  9:35 PM  Group Topic/Focus:   Participation Level:  Active  Participation Quality:  Appropriate  Affect:  Appropriate  Cognitive:  Appropriate  Insight: Appropriate  Engagement in Group:  Engaged  Modes of Intervention:  Discussion  Additional Comments:  Pt attended and participated group.  Eytan Carrigan 12/26/2018, 9:35 PM

## 2018-12-26 NOTE — Progress Notes (Signed)
   12/25/18 2100  Psych Admission Type (Psych Patients Only)  Admission Status Voluntary  Psychosocial Assessment  Eye Contact Fair  Facial Expression Animated;Anxious  Affect Depressed  Speech Logical/coherent  Interaction Assertive  Motor Activity Restless  Appearance/Hygiene Unremarkable  Behavior Characteristics Appropriate to situation;Anxious  Mood Anxious;Preoccupied  Thought Process  Coherency WDL  Content WDL  Delusions None reported or observed  Perception WDL  Hallucination None reported or observed  Judgment Limited  Confusion None  Danger to Self  Current suicidal ideation? Denies (while in hospital)  Danger to Others  Danger to Others None reported or observed

## 2018-12-27 MED ORDER — ALUM & MAG HYDROXIDE-SIMETH 200-200-20 MG/5ML PO SUSP
30.0000 mL | ORAL | Status: DC | PRN
  Administered 2018-12-27: 30 mL via ORAL

## 2018-12-27 NOTE — [facility] Group Notes (Addendum)
[place] Group Notes:  (Nursing/MHT/Case Management/Adjunct)  Date:  12/27/2018  Time:  1100 am  Type of Therapy:  Nurse Education [employer] for 2021  Participation Level:  Active  Participation Quality:  Appropriate  Affect:  Appropriate  Cognitive:  Appropriate  Insight:  Appropriate  Engagement in Group:  Engaged  Modes of Intervention:  Activity  Summary of Progress/Problems:  [name] 12/27/2018, 11:46 AM

## 2018-12-27 NOTE — Progress Notes (Signed)
Pt is labile, childlike, intrusive and attention seeking. Pt rates depression 2/10. Anxiety 10/10. Hopelessness 3/10. Pt denies SI/HI.     12/27/18 0800  Psych Admission Type (Psych Patients Only)  Admission Status Voluntary  Psychosocial Assessment  Patient Complaints Sleep disturbance  Eye Contact Fair  Facial Expression Animated  Affect Anxious  Speech Logical/coherent  Interaction Childlike  Motor Activity Fidgety  Appearance/Hygiene Unremarkable  Behavior Characteristics Anxious  Mood Anxious;Labile  Thought Process  Coherency Concrete thinking  Content Blaming others  Delusions None reported or observed  Perception WDL  Hallucination None reported or observed  Judgment Limited  Confusion None  Danger to Self  Current suicidal ideation? Denies  Danger to Others  Danger to Others None reported or observed

## 2018-12-27 NOTE — Progress Notes (Signed)
   12/27/18 2140  Psych Admission Type (Psych Patients Only)  Admission Status Voluntary  Psychosocial Assessment  Patient Complaints None  Eye Contact Fair  Facial Expression Animated  Affect Anxious  Speech Logical/coherent  Interaction Childlike  Motor Activity Fidgety  Appearance/Hygiene Unremarkable  Behavior Characteristics Cooperative;Anxious  Mood Anxious  Thought Process  Coherency WDL  Content Ambivalence  Delusions None reported or observed  Perception WDL  Hallucination None reported or observed  Judgment Impaired  Confusion None  Danger to Self  Current suicidal ideation? Denies  Danger to Others  Danger to Others None reported or observed   D-Pt denies SI/HI/AVH.  Interacting well with others on the unit.  No complaints voiced at this time.  A-Provided emotional support and active listening.  Medications administered per physician orders. Safety checks remain in place.  R-Pt remains safe on the unit. Pt will let staff know if pt needs assistance.

## 2018-12-27 NOTE — Progress Notes (Signed)
[facility] MD Progress Note  12/27/2018 4:16 PM Tina Hale  MRN:  [id number] Subjective: She denies medication side effects.  Denies suicidal ideations.  Reports lingering pain, affecting her back, but today mostly on right wrist.  Objective: I have reviewed chart notes and met with patient. 23 year old female, lives with sister.  Presented to ED reporting worsening depression, suicidal thoughts and recent overdose on Motrin, Naprosyn, Tylenol, Advil, aspirin.  Reports she talks about 2 of each in the evening and then 2 more beach in the morning.  States that intention was to relieve pain but also acknowledges recent SI as well as neurovegetative symptoms of depression.  History of prior psychiatric admission in 2019 for overdose.  Prior history of PTSD diagnosis.  Presents alert, attentive, pleasant on approach.  Noted to be visible in dayroom and interactive with peers.  Affect noted to be reactive and fuller in range when interacting in the milieu.  She does acknowledge improving mood although describes some lingering depression .  Remains somatically focused but to a lesser degree than on admission.  As she improves she is becoming more future oriented and more focused on disposition planning options.  Today patient also reviewed history of brief explosive episodes when angry.  States that she has had past episodes of toppling furniture/throwing things/punching walls when angered.  She states she is hopeful that medication will also help decrease angry outbursts.  She has had no agitated or violent outbursts on unit and presents calm. Currently denies suicidal ideations. Thus far tolerating Neurontin and Cymbalta well, without side effects.   Principal Problem: MDD (major depressive disorder) Diagnosis: Principal Problem:   MDD (major depressive disorder)  Total Time spent with patient: 20 minutes  Past Psychiatric History:   Past Medical History:  Past Medical History:  Diagnosis Date  . ADD  (attention deficit disorder)   . ADHD (attention deficit hyperactivity disorder)   . Anemia   . Anger   . Anxiety   . Asthma   . Depression   . Head trauma   . Headache   . Memory loss   . PTSD (post-traumatic stress disorder)    History reviewed. No pertinent surgical history. Family History:  Family History  Problem Relation Age of Onset  . High blood pressure Mother   . High blood pressure Father   . Cancer Maternal Grandmother    Family Psychiatric  History:  Social History:  Social History   Substance and Sexual Activity  Alcohol Use No  . Alcohol/week: 0.0 standard drinks     Social History   Substance and Sexual Activity  Drug Use No    Social History   Socioeconomic History  . Marital status: Single    Spouse name: Not on file  . Number of children: Not on file  . Years of education: Not on file  . Highest education level: Not on file  Occupational History  . Not on file  Tobacco Use  . Smoking status: Passive Smoke Exposure - Never Smoker  . Smokeless tobacco: Never Used  . Tobacco comment: Step dad smokes  Substance and Sexual Activity  . Alcohol use: No    Alcohol/week: 0.0 standard drinks  . Drug use: No  . Sexual activity: Not Currently    Birth control/protection: Implant    Comment: not at present  Other Topics Concern  . 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  She lives with her mother and siblings. She enjoys praise dance and singing in the choir.                ** Merged History Encounter **       Social Determinants of Health   Financial Resource Strain:   . Difficulty of Paying Living Expenses: Not on file  Food Insecurity:   . Worried About Charity fundraiser in the Last Year: Not on file  . Ran Out of Food in the Last Year: Not on file  Transportation Needs:   . Lack of Transportation (Medical): Not on file  . Lack of Transportation (Non-Medical): Not  on file  Physical Activity:   . Days of Exercise per Week: Not on file  . Minutes of Exercise per Session: Not on file  Stress:   . Feeling of Stress : Not on file  Social Connections:   . Frequency of Communication with Friends and Family: Not on file  . Frequency of Social Gatherings with Friends and Family: Not on file  . Attends Religious Services: Not on file  . Active Member of Clubs or Organizations: Not on file  . Attends Archivist Meetings: Not on file  . Marital Status: Not on file   Additional Social History:   Sleep: improving   Appetite:  Improving  Current Medications: Current Facility-Administered Medications  Medication Dose Route Frequency Provider Last Rate Last Admin  . DULoxetine (CYMBALTA) DR capsule 40 mg  40 mg Oral Daily Rinaldo Macqueen, Myer Peer, MD   40 mg at 12/27/18 0758  . gabapentin (NEURONTIN) capsule 300 mg  300 mg Oral TID Sherida Dobkins, Myer Peer, MD   300 mg at 12/27/18 1206  . lidocaine (LIDODERM) 5 % 1 patch  1 patch Transdermal Q24H Allice Garro, Myer Peer, MD   1 patch at 12/26/18 2227  . LORazepam (ATIVAN) tablet 0.5 mg  0.5 mg Oral Q6H PRN Kaven Cumbie, Myer Peer, MD   0.5 mg at 12/26/18 2226  . metFORMIN (GLUCOPHAGE) tablet 500 mg  500 mg Oral Q breakfast Jalaysia Lobb, Myer Peer, MD   500 mg at 12/27/18 0758  . pantoprazole (PROTONIX) EC tablet 40 mg  40 mg Oral Daily Nichalas Coin, Myer Peer, MD   40 mg at 12/27/18 0758  . polyethylene glycol (MIRALAX / GLYCOLAX) packet 17 g  17 g Oral Daily PRN Gaylyn Berish A, MD      . sucralfate (CARAFATE) tablet 1 g  1 g Oral TID WC & HS Merlin Golden, Myer Peer, MD   1 g at 12/27/18 1206  . traMADol (ULTRAM) tablet 25 mg  25 mg Oral Q12H PRN Marializ Ferrebee, Myer Peer, MD   25 mg at 12/27/18 1545    Lab Results:  No results found for this or any previous visit (from the past 48 hour(s)).  Blood Alcohol level:  Lab Results  Component Value Date   ETH <10 12/22/2018   ETH <10 63/87/5643    Metabolic Disorder Labs: Lab Results   Component Value Date   HGBA1C 5.5 12/23/2018   MPG 111.15 12/23/2018   MPG 102.54 10/30/2017   Lab Results  Component Value Date   PROLACTIN 25.6 (H) 10/30/2017   Lab Results  Component Value Date   CHOL 95 12/23/2018   TRIG 75 12/23/2018   HDL 43 12/23/2018   CHOLHDL 2.2 12/23/2018   VLDL 15 12/23/2018   LDLCALC 37 12/23/2018   LDLCALC 48 10/30/2017    Physical Findings: AIMS: Facial and Oral  Movements Muscles of Facial Expression: None, normal Lips and Perioral Area: None, normal Jaw: None, normal Tongue: None, normal,Extremity Movements Upper (arms, wrists, hands, fingers): None, normal Lower (legs, knees, ankles, toes): None, normal, Trunk Movements Neck, shoulders, hips: None, normal, Overall Severity Severity of abnormal movements (highest score from questions above): None, normal Incapacitation due to abnormal movements: None, normal Patient's awareness of abnormal movements (rate only patient's report): No Awareness, Dental Status Current problems with teeth and/or dentures?: No Does patient usually wear dentures?: No  CIWA:  CIWA-Ar Total: 1 COWS:     Musculoskeletal: Strength & Muscle Tone: within normal limits Gait & Station: normal mobilizes in wheelchair at times, but is noted to be able to ambulate/walk at times without difficulty Patient leans: N/A  Psychiatric Specialty Exam: Physical Exam  Review of Systems chronic back pain, right wrist pain, today does not report photosensitivity/photophobia or chest pain.  No nausea, no vomiting  Blood pressure 104/60, pulse (!) 110, temperature 98.2 F (36.8 C), temperature source Oral, resp. rate 16, height 5' 2.99" (1.6 m), weight 114.3 kg, SpO2 100 %.Body mass index is 44.65 kg/m.  General Appearance: Improving grooming  Eye Contact:  Improving  Speech:  Normal  Volume:  Normal  Mood:  Gradually improving mood  Affect:  Affect is becoming fuller in range, and is noticed to be brighter during interaction  with peers in milieu  Thought Process:  Linear and Descriptions of Associations: Intact  Orientation:  Other:  Fully alert and attentive  Thought Content:  No hallucinations, no delusions, somatic preoccupations have decreased in intensity  Suicidal Thoughts:  No currently denies suicidal or self-injurious ideations and contracts for safety on unit at this time  Homicidal Thoughts:  No  Memory:  Recent and remote grossly intact  Judgement:  improving  Insight:  Fair  Psychomotor Activity:  Normal-no psychomotor agitation or restlessness noted or reported  Concentration:  Concentration: Good and Attention Span: Good  Recall:  Good  Fund of Knowledge:  Good  Language:  Good  Akathisia:  Negative  Handed:  Right  AIMS (if indicated):     Assets:  Communication Skills Desire for Improvement Resilience  ADL's:  Intact  Cognition:  WNL  Sleep:  Number of Hours: 6.75   Assessment-  23 year old female, lives with sister.  Presented to ED reporting worsening depression, suicidal thoughts and recent overdose on Motrin, Naprosyn, Tylenol, Advil, aspirin.  Reports she talks about 2 of each in the evening and then 2 more beach in the morning.  States that intention was to relieve pain but also acknowledges recent SI as well as neurovegetative symptoms of depression.  History of prior psychiatric admission in 2019 for overdose.  Prior history of PTSD diagnosis. Reports chronic pain times several months affecting chest and back mainly.   Gradually improving mood and range of affect.  Interacting appropriately with peers.  Has presented with several somatic concerns, affecting different areas, suggestive of somatic symptom disorder.  The intensity of these concerns seems to be decreasing gradually and is less focused on pain issues at this time.  Appears comfortable and in no acute distress.  Today reports a history of intermittent explosiveness in the past.  Behavior on unit has been in good control  without disruptive or violent behaviors.  Currently on Cymbalta/Neurontin, tolerating well thus far. .  Treatment Plan Summary: Daily contact with patient to assess and evaluate symptoms and progress in treatment, Medication management, Plan Inpatient treatment and Medications as below  Treatment Plan reviewed as below today 12/12 Encourage group and milieu participation Continue Neurontin  300 mg 3 times daily for pain and anxiety Continue Cymbalta  40 mg daily for depression, pain Continue Lidoderm patch to affected area of back for analgesia/back pain. Continue Ultram  25  mg every 12 hours as needed for pain Continue Protonix and Carafate to address potential GERD symptoms Continue Ativan 0.5 mg every 6 hours as needed for anxiety as needed Continue Miralax/Glycolax for constipation as needed. Treatment team working on disposition planning options Jenne Campus, MD 12/27/2018, 4:16 PM    Patient ID: Sima Matas, female   DOB: September 15, 1995, 23 y.o.   MRN: [id number]

## 2018-12-27 NOTE — Progress Notes (Signed)
D.  Pt pleasant on approach, had been crying after speaking with mother on the phone.  Pt was unclear about what exactly upset her but stated that she helps her mother out and doesn't want mother to forget her.  When questioned further patient states that mother is getting old (31) and patient worries about dementia.  Mother does not have dementia at this time Pt clarified.  Pt does continue to report back pain and was given ordered medication for this.  She prefers to put on lidocaine patch at night.  Pt denies SI/HI/AVH at this time.  Pt was positive for evening wrap up group.  A.   Support and encouragement offered to patient.  Medications given as ordered.  R.  Pt remains safe on the unit, will continue to monitor.

## 2018-12-28 MED ORDER — LORAZEPAM 0.5 MG PO TABS
0.5000 mg | ORAL_TABLET | Freq: Four times a day (QID) | ORAL | Status: DC | PRN
  Administered 2018-12-28: 0.5 mg via ORAL
  Filled 2018-12-28: qty 1

## 2018-12-28 MED ORDER — DULOXETINE HCL 60 MG PO CPEP
60.0000 mg | ORAL_CAPSULE | Freq: Every day | ORAL | Status: DC
  Administered 2018-12-29: 09:00:00 60 mg via ORAL
  Filled 2018-12-28: qty 7
  Filled 2018-12-28: qty 1
  Filled 2018-12-28: qty 7

## 2018-12-28 NOTE — [facility] Suicide Risk Assessment (Signed)
[facility] INPATIENT:  Family/Significant Other Suicide Prevention Education  Suicide Prevention Education:  Education Completed; Leonia Arman, mother [phone] has been identified by the patient as the family member/significant other with whom the patient will be residing, and identified as the person(s) who will aid the patient in the event of a mental health crisis (suicidal ideations/suicide attempt).  With written consent from the patient, the family member/significant other has been provided the following suicide prevention education, prior to the and/or following the discharge of the patient.  The suicide prevention education provided includes the following:  Suicide risk factors  Suicide prevention and interventions  National Suicide Hotline telephone number  [facility] assessment telephone number  [facility] Emergency Assistance [place] and/or Residential Mobile Crisis Unit telephone number  Request made of family/significant other to:  Remove weapons (e.g., guns, rifles, knives), all items previously/currently identified as safety concern.    Remove drugs/medications (over-the-counter, prescriptions, illicit drugs), all items previously/currently identified as a safety concern.  The family member/significant other verbalizes understanding of the suicide prevention education information provided.  The family member/significant other agrees to remove the items of safety concern listed above. Ms. Yarbro reports the patient has a good support system. She raises concern about the patient feeling left out at times and not being able to work. She denies the patient having access to guns or weapons in the home and does not report any reservation about her returning home. Ms. Deason says she has a positive relationship with the patient and patient gets along with her sister. She expressed desire for the patient to learn health coping methods to assist her with handling  life stressors.  Florentine Diekman T Quintina Hakeem 12/28/2018, 2:58 PM

## 2018-12-28 NOTE — Progress Notes (Signed)
The patient has been observed walking around in the hallway without the use of her wheelchair. She has also been observed standing up without assistance.

## 2018-12-28 NOTE — Progress Notes (Signed)
Patient just approached the nurses station and requested two ice packs since she just hit the wall with her right fist.

## 2018-12-28 NOTE — Progress Notes (Signed)
Patient stated in group that she had a good day since she made a new friend. Her goal for tomorrow is to get discharged.

## 2018-12-28 NOTE — Progress Notes (Signed)
Pt reported that her chest hurt.  Vital signs were taken - BP 123/90 HR 105.  Pt stated "My ultram was due at 4 am and I didn't get up and ask for it."  Ultram administered.  RN will continue to monitor pt's pain level and provide assistance as needed.

## 2018-12-28 NOTE — Progress Notes (Signed)
Spiritual care group on grief and loss facilitated by chaplain Jerene Pitch MDiv, [facility]  Group Goal:  Support / Education around grief and loss Members engage in facilitated group support and psycho-social education.  Group Description:  Following introductions and group rules, group members engaged in facilitated group dialog and support around topic of loss, with particular support around experiences of loss in their lives. Group Identified types of loss (relationships / self / things) and identified patterns, circumstances, and changes that precipitate losses. Reflected on thoughts / feelings around loss, normalized grief responses, and recognized variety in grief experience.   Group noted Worden's four tasks of grief in discussion.  Group drew on Adlerian / Rogerian, narrative, MI, Patient Progress:  Tina Hale was present throughout group.  She was noted moving across room to comfort another group member who was expressing how her family narrative has been to "suck it up" and not express that one needs support.  Group was offering affirmation for this member for noticing this narrative. Tina Hale offered comfort to this patient.  She left room and came back later with ice pack on had.  She asked group for coping mechanisms around "Rage"  Described that in listening to this group member, she became angry and had punched something when she left room

## 2018-12-28 NOTE — Plan of Care (Signed)
Progress note  D: pt found in bed; compliant with medication administration. Pt rates their depression/hopelessness/anxiety a 2/2/2 out of 10 respectively. Pt complains of back pain that they rate at a 10/10. Pt has been viewed ambulating, laughing, and performing ADL's appropriately. Pt was provided pain medication before this writer's shift. Pt continues to be needy and attention seeking at times. Pt hit the wall today and asked to be taken out of the milieu to cool down. Pt states they are focused on discharge for their goal but their actions are incongruent. Pt denies si/hi/ah/vh and verbally agrees to approach staff if these become apparent or before harming themself/others while at [place].  A: Pt provided support and encouragement. Pt given medication per protocol and standing orders. Q67m safety checks implemented and continued.  R: Pt safe on the unit. Will continue to monitor.  Pt progressing in the following metrics  Problem: Education: Goal: Ability to state activities that reduce stress will improve Outcome: Adequate for Discharge   Problem: Coping: Goal: Ability to identify and develop effective coping behavior will improve Outcome: Adequate for Discharge   Problem: Self-Concept: Goal: Ability to identify factors that promote anxiety will improve Outcome: Adequate for Discharge Goal: Level of anxiety will decrease Outcome: Adequate for Discharge Goal: Ability to modify response to factors that promote anxiety will improve Outcome: Adequate for Discharge   Problem: Education: Goal: Utilization of techniques to improve thought processes will improve Outcome: Adequate for Discharge Goal: Knowledge of the prescribed therapeutic regimen will improve Outcome: Adequate for Discharge   Problem: Activity: Goal: Interest or engagement in leisure activities will improve Outcome: Adequate for Discharge Goal: Imbalance in normal sleep/wake cycle will improve Outcome: Adequate for  Discharge   Problem: Coping: Goal: Coping ability will improve Outcome: Adequate for Discharge Goal: Will verbalize feelings Outcome: Adequate for Discharge   Problem: Health Behavior/Discharge Planning: Goal: Ability to make decisions will improve Outcome: Adequate for Discharge Goal: Compliance with therapeutic regimen will improve Outcome: Adequate for Discharge   Problem: Role Relationship: Goal: Will demonstrate positive changes in social behaviors and relationships Outcome: Adequate for Discharge   Problem: Safety: Goal: Ability to disclose and discuss suicidal ideas will improve Outcome: Adequate for Discharge Goal: Ability to identify and utilize support systems that promote safety will improve Outcome: Adequate for Discharge   Problem: Self-Concept: Goal: Will verbalize positive feelings about self Outcome: Adequate for Discharge Goal: Level of anxiety will decrease Outcome: Adequate for Discharge   Problem: Education: Goal: Knowledge of [facility] Education information/materials will improve Outcome: Adequate for Discharge Goal: Emotional status will improve Outcome: Adequate for Discharge Goal: Mental status will improve Outcome: Adequate for Discharge Goal: Verbalization of understanding the information provided will improve Outcome: Adequate for Discharge   Problem: Activity: Goal: Interest or engagement in activities will improve Outcome: Adequate for Discharge Goal: Sleeping patterns will improve Outcome: Adequate for Discharge   Problem: Coping: Goal: Ability to verbalize frustrations and anger appropriately will improve Outcome: Adequate for Discharge Goal: Ability to demonstrate self-control will improve Outcome: Adequate for Discharge   Problem: Health Behavior/Discharge Planning: Goal: Identification of resources available to assist in meeting health care needs will improve Outcome: Adequate for Discharge Goal: Compliance with  treatment plan for underlying cause of condition will improve Outcome: Adequate for Discharge   Problem: Physical Regulation: Goal: Ability to maintain clinical measurements within normal limits will improve Outcome: Adequate for Discharge   Problem: Safety: Goal: Periods of time without injury will increase Outcome: Adequate  for Discharge   Problem: Education: Goal: Ability to make informed decisions regarding treatment will improve Outcome: Adequate for Discharge   Problem: Coping: Goal: Coping ability will improve Outcome: Adequate for Discharge   Problem: Health Behavior/Discharge Planning: Goal: Identification of resources available to assist in meeting health care needs will improve Outcome: Adequate for Discharge   Problem: Medication: Goal: Compliance with prescribed medication regimen will improve Outcome: Adequate for Discharge   Problem: Self-Concept: Goal: Ability to disclose and discuss suicidal ideas will improve Outcome: Adequate for Discharge Goal: Will verbalize positive feelings about self Outcome: Adequate for Discharge

## 2018-12-28 NOTE — Tx Team (Signed)
Interdisciplinary Treatment and Diagnostic Plan Update  12/28/2018 Time of Session: 9am Tina Hale MRN: [id number]  Principal Diagnosis: MDD (major depressive disorder)  Secondary Diagnoses: Principal Problem:   MDD (major depressive disorder)   Current Medications:  Current Facility-Administered Medications  Medication Dose Route Frequency Provider Last Rate Last Admin  . alum & mag hydroxide-simeth (MAALOX/MYLANTA) 200-200-20 MG/5ML suspension 30 mL  30 mL Oral Q4H PRN Cobos, Myer Peer, MD   30 mL at 12/27/18 1751  . [START ON 12/29/2018] DULoxetine (CYMBALTA) DR capsule 60 mg  60 mg Oral Daily Cobos, Fernando A, MD      . gabapentin (NEURONTIN) capsule 300 mg  300 mg Oral TID Cobos, Myer Peer, MD   300 mg at 12/28/18 1143  . lidocaine (LIDODERM) 5 % 1 patch  1 patch Transdermal Q24H Cobos, Myer Peer, MD   1 patch at 12/27/18 2144  . LORazepam (ATIVAN) tablet 0.5 mg  0.5 mg Oral Q6H PRN Cobos, Fernando A, MD      . metFORMIN (GLUCOPHAGE) tablet 500 mg  500 mg Oral Q breakfast Cobos, Myer Peer, MD   500 mg at 12/28/18 0743  . pantoprazole (PROTONIX) EC tablet 40 mg  40 mg Oral Daily Cobos, Myer Peer, MD   40 mg at 12/28/18 0743  . polyethylene glycol (MIRALAX / GLYCOLAX) packet 17 g  17 g Oral Daily PRN Cobos, Fernando A, MD      . sucralfate (CARAFATE) tablet 1 g  1 g Oral TID WC & HS Cobos, Myer Peer, MD   1 g at 12/28/18 1143   PTA Medications: Medications Prior to Admission  Medication Sig Dispense Refill Last Dose  . dicyclomine (BENTYL) 20 MG tablet Take 1 tablet (20 mg total) by mouth 2 (two) times daily. 20 tablet 0   . famotidine (PEPCID) 20 MG tablet Take 1 tablet (20 mg total) by mouth 2 (two) times daily. (Patient not taking: Reported on 12/22/2018) 20 tablet 0   . FLUoxetine (PROZAC) 20 MG capsule Take 1 capsule (20 mg total) by mouth at bedtime. For mood control 30 capsule 0   . Fluticasone-Salmeterol (ADVAIR DISKUS) 100-50 MCG/DOSE AEPB Inhale 1 puff into the  lungs daily as needed (asthma).      . hydrOXYzine (ATARAX/VISTARIL) 25 MG tablet Take 1 tablet (25 mg total) by mouth every 6 (six) hours as needed for anxiety. 30 tablet 0   . ibuprofen (ADVIL) 800 MG tablet Take 800 mg by mouth every 6 (six) hours as needed for mild pain or moderate pain.      Marland Kitchen ibuprofen (ADVIL,MOTRIN) 600 MG tablet Take 1 tablet (600 mg total) by mouth every 6 (six) hours as needed. (Patient not taking: Reported on 12/22/2018) 10 tablet 0   . meloxicam (MOBIC) 15 MG tablet Take 1 tablet (15 mg total) by mouth daily as needed for pain. 10 tablet 0   . metFORMIN (GLUCOPHAGE) 500 MG tablet Take 500 mg by mouth 2 (two) times daily.  0   . methocarbamol (ROBAXIN) 500 MG tablet Take 500 mg by mouth 3 (three) times daily.     . naproxen (NAPROSYN) 500 MG tablet Take 500 mg by mouth 2 (two) times daily.     Marland Kitchen OLANZapine (ZYPREXA) 10 MG tablet Take 1 tablet (10 mg total) by mouth at bedtime. for mood control (Patient not taking: Reported on 12/08/2017) 30 tablet 0   . ondansetron (ZOFRAN) 4 MG tablet Take 1 tablet (4 mg total) by mouth every 6 (six)  hours. (Patient not taking: Reported on 12/22/2018) 12 tablet 0   . prazosin (MINIPRESS) 2 MG capsule Take 1 capsule (2 mg total) by mouth at bedtime. For nightmares (Patient not taking: Reported on 12/22/2018) 30 capsule 0     Patient Stressors: Financial difficulties Health problems Medication change or noncompliance  Patient Strengths: Ability for insight Average or above average intelligence Capable of independent living Motivation for treatment/growth Supportive family/friends  Treatment Modalities: Medication Management, Group therapy, Case management,  1 to 1 session with clinician, Psychoeducation, Recreational therapy.   Physician Treatment Plan for Primary Diagnosis: MDD (major depressive disorder) Long Term Goal(s): Improvement in symptoms so as ready for discharge Improvement in symptoms so as ready for discharge    Short Term Goals: Ability to identify changes in lifestyle to reduce recurrence of condition will improve Ability to verbalize feelings will improve Ability to disclose and discuss suicidal ideas Ability to demonstrate self-control will improve Ability to identify and develop effective coping behaviors will improve  Medication Management: Evaluate patient's response, side effects, and tolerance of medication regimen.  Therapeutic Interventions: 1 to 1 sessions, Unit Group sessions and Medication administration.  Evaluation of Outcomes: Adequate for Discharge  Physician Treatment Plan for Secondary Diagnosis: Principal Problem:   MDD (major depressive disorder)  Long Term Goal(s): Improvement in symptoms so as ready for discharge Improvement in symptoms so as ready for discharge   Short Term Goals: Ability to identify changes in lifestyle to reduce recurrence of condition will improve Ability to verbalize feelings will improve Ability to disclose and discuss suicidal ideas Ability to demonstrate self-control will improve Ability to identify and develop effective coping behaviors will improve     Medication Management: Evaluate patient's response, side effects, and tolerance of medication regimen.  Therapeutic Interventions: 1 to 1 sessions, Unit Group sessions and Medication administration.  Evaluation of Outcomes: Adequate for Discharge   RN Treatment Plan for Primary Diagnosis: MDD (major depressive disorder) Long Term Goal(s): Knowledge of disease and therapeutic regimen to maintain health will improve  Short Term Goals: Ability to verbalize feelings will improve, Ability to disclose and discuss suicidal ideas and Ability to identify and develop effective coping behaviors will improve  Medication Management: RN will administer medications as ordered by provider, will assess and evaluate patient's response and provide education to patient for prescribed medication. RN will  report any adverse and/or side effects to prescribing provider.  Therapeutic Interventions: 1 on 1 counseling sessions, Psychoeducation, Medication administration, Evaluate responses to treatment, Monitor vital signs and CBGs as ordered, Perform/monitor CIWA, COWS, AIMS and Fall Risk screenings as ordered, Perform wound care treatments as ordered.  Evaluation of Outcomes: Adequate for Discharge   LCSW Treatment Plan for Primary Diagnosis: MDD (major depressive disorder) Long Term Goal(s): Safe transition to appropriate next level of care at discharge, Engage patient in therapeutic group addressing interpersonal concerns.  Short Term Goals: Engage patient in aftercare planning with referrals and resources  Therapeutic Interventions: Assess for all discharge needs, 1 to 1 time with Social worker, Explore available resources and support systems, Assess for adequacy in community support network, Educate family and significant other(s) on suicide prevention, Complete Psychosocial Assessment, Interpersonal group therapy.  Evaluation of Outcomes: Adequate for Discharge   Progress in Treatment: Attending groups: Yes. Participating in groups: Yes. Taking medication as prescribed: Yes. Toleration medication: Yes. Family/Significant other contact made: Yes, individual(s) contacted:  the patient's mother Patient understands diagnosis: Yes. Discussing patient identified problems/goals with staff: Yes. Medical problems stabilized or resolved:  No. Denies suicidal/homicidal ideation: No. Issues/concerns per patient self-inventory: No. Other: NA  New problem(s) identified: No, Describe:  none reported  New Short Term/Long Term Goal(s):Attend outpatient treatment, take medication as prescribed, develop and implement healthy coping methods.  Patient Goals:  "Get the help I need"  Discharge Plan or Barriers: Pt will return home and follow up with outpatient treatment at [facility].   Reason for  Continuation of Hospitalization: Medication stabilization  Estimated Length of Stay:1-7 days  Attendees: Patient:Tina Hale 12/28/2018 3:48 PM  Physician: Dr. Neita Garnet, MD 12/28/2018 3:48 PM  Nursing: Harriett Sine 12/28/2018 3:48 PM  RN Care Manager: 12/28/2018 3:48 PM  Social Worker: Sanjuana Kava; Radonna Ricker, LCSW 12/28/2018 3:48 PM  Recreational Therapist:  12/28/2018 3:48 PM  Other: Harriett Sine, NP 12/28/2018 3:48 PM  Other:  12/28/2018 3:48 PM  Other: 12/28/2018 3:48 PM    Scribe for Treatment Team: Marylee Floras, [place] 12/28/2018 3:48 PM

## 2018-12-28 NOTE — Progress Notes (Signed)
[facility] MD Progress Note  12/28/2018 3:10 PM Tina Hale  MRN:  [id number] Subjective: patient reports some improvement , but reports feeling " more tired today". Endorses some persistent depression, but acknowledges that overall her mood has improved since admission. Reports back pain and wrist pain.  Denies medication side effects .  Objective: I have discussed case with treatment team and met with patient. 23 year old female, lives with sister.  Presented to ED reporting worsening depression, suicidal thoughts and recent overdose on Motrin, Naprosyn, Tylenol, Advil, aspirin.  Reports she talks about 2 of each in the evening and then 2 more beach in the morning.  States that intention was to relieve pain but also acknowledges recent SI as well as neurovegetative symptoms of depression.  History of prior psychiatric admission in 2019 for overdose.  Prior history of PTSD diagnosis.  Patient remains vaguely depressed but is presenting with partially improved mood. Affect less constricted and smiles briefly at times . Denies suicidal ideations at this time, and presents future oriented, focusing more on disposition planning, for example wanting to insure that the medications she is currently on will be continued at discharge. Continues to present with somatic concerns, including wrist pain, back aches, headaches. At this time presents calm, comfortable and in no acute distress . Today reports vague HI towards her father , who resides in [place] and whom she does not see often. No specific plan or intention. States she is angry because during a recent phone conversation father apparently threatened to have her committed involuntarily after she is discharged . Of note, denies any plan or intention of seeking or visiting father, who lives in another city, and denies plan or intention. States " I am just angry with him". No disruptive or agitated behaviors on unit .   Principal Problem: MDD (major depressive  disorder) Diagnosis: Principal Problem:   MDD (major depressive disorder)  Total Time spent with patient: 20 minutes  Past Psychiatric History:   Past Medical History:  Past Medical History:  Diagnosis Date  . ADD (attention deficit disorder)   . ADHD (attention deficit hyperactivity disorder)   . Anemia   . Anger   . Anxiety   . Asthma   . Depression   . Head trauma   . Headache   . Memory loss   . PTSD (post-traumatic stress disorder)    History reviewed. No pertinent surgical history. Family History:  Family History  Problem Relation Age of Onset  . High blood pressure Mother   . High blood pressure Father   . Cancer Maternal Grandmother    Family Psychiatric  History:  Social History:  Social History   Substance and Sexual Activity  Alcohol Use No  . Alcohol/week: 0.0 standard drinks     Social History   Substance and Sexual Activity  Drug Use No    Social History   Socioeconomic History  . Marital status: Single    Spouse name: Not on file  . Number of children: Not on file  . Years of education: Not on file  . Highest education level: Not on file  Occupational History  . Not on file  Tobacco Use  . Smoking status: Passive Smoke Exposure - Never Smoker  . Smokeless tobacco: Never Used  . Tobacco comment: Step dad smokes  Substance and Sexual Activity  . Alcohol use: No    Alcohol/week: 0.0 standard drinks  . Drug use: No  . Sexual activity: Not Currently    Birth control/protection:  Implant    Comment: not at present  Other Topics Concern  . 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 She lives with her mother and siblings. She enjoys praise dance and singing in the choir.                ** Merged History Encounter **       Social Determinants of Health   Financial Resource Strain:   . Difficulty of Paying Living Expenses: Not on file  Food Insecurity:   . Worried  About Charity fundraiser in the Last Year: Not on file  . Ran Out of Food in the Last Year: Not on file  Transportation Needs:   . Lack of Transportation (Medical): Not on file  . Lack of Transportation (Non-Medical): Not on file  Physical Activity:   . Days of Exercise per Week: Not on file  . Minutes of Exercise per Session: Not on file  Stress:   . Feeling of Stress : Not on file  Social Connections:   . Frequency of Communication with Friends and Family: Not on file  . Frequency of Social Gatherings with Friends and Family: Not on file  . Attends Religious Services: Not on file  . Active Member of Clubs or Organizations: Not on file  . Attends Archivist Meetings: Not on file  . Marital Status: Not on file   Additional Social History:   Sleep: improving   Appetite:  Improving  Current Medications: Current Facility-Administered Medications  Medication Dose Route Frequency Provider Last Rate Last Admin  . alum & mag hydroxide-simeth (MAALOX/MYLANTA) 200-200-20 MG/5ML suspension 30 mL  30 mL Oral Q4H PRN Rakesh Dutko, Myer Peer, MD   30 mL at 12/27/18 1751  . [START ON 12/29/2018] DULoxetine (CYMBALTA) DR capsule 60 mg  60 mg Oral Daily Masato Pettie A, MD      . gabapentin (NEURONTIN) capsule 300 mg  300 mg Oral TID Johnpatrick Jenny, Myer Peer, MD   300 mg at 12/28/18 1143  . lidocaine (LIDODERM) 5 % 1 patch  1 patch Transdermal Q24H Latecia Miler, Myer Peer, MD   1 patch at 12/27/18 2144  . LORazepam (ATIVAN) tablet 0.5 mg  0.5 mg Oral Q6H PRN Melek Pownall A, MD      . metFORMIN (GLUCOPHAGE) tablet 500 mg  500 mg Oral Q breakfast Royce Stegman, Myer Peer, MD   500 mg at 12/28/18 0743  . pantoprazole (PROTONIX) EC tablet 40 mg  40 mg Oral Daily Sophiagrace Benbrook, Myer Peer, MD   40 mg at 12/28/18 0743  . polyethylene glycol (MIRALAX / GLYCOLAX) packet 17 g  17 g Oral Daily PRN Marquel Pottenger A, MD      . sucralfate (CARAFATE) tablet 1 g  1 g Oral TID WC & HS Nareg Breighner, Myer Peer, MD   1 g at 12/28/18 1143   . traMADol (ULTRAM) tablet 25 mg  25 mg Oral Q12H PRN Deshayla Empson, Myer Peer, MD   25 mg at 12/28/18 0605    Lab Results:  No results found for this or any previous visit (from the past 48 hour(s)).  Blood Alcohol level:  Lab Results  Component Value Date   [facility] <10 12/22/2018   ETH <10 02/54/2706    Metabolic Disorder Labs: Lab Results  Component Value Date   HGBA1C 5.5 12/23/2018   MPG 111.15 12/23/2018   MPG 102.54 10/30/2017   Lab  Results  Component Value Date   PROLACTIN 25.6 (H) 10/30/2017   Lab Results  Component Value Date   CHOL 95 12/23/2018   TRIG 75 12/23/2018   HDL 43 12/23/2018   CHOLHDL 2.2 12/23/2018   VLDL 15 12/23/2018   LDLCALC 37 12/23/2018   LDLCALC 48 10/30/2017    Physical Findings: AIMS: Facial and Oral Movements Muscles of Facial Expression: None, normal Lips and Perioral Area: None, normal Jaw: None, normal Tongue: None, normal,Extremity Movements Upper (arms, wrists, hands, fingers): None, normal Lower (legs, knees, ankles, toes): None, normal, Trunk Movements Neck, shoulders, hips: None, normal, Overall Severity Severity of abnormal movements (highest score from questions above): None, normal Incapacitation due to abnormal movements: None, normal Patient's awareness of abnormal movements (rate only patient's report): No Awareness, Dental Status Current problems with teeth and/or dentures?: No Does patient usually wear dentures?: No  CIWA:  CIWA-Ar Total: 1 COWS:     Musculoskeletal: Strength & Muscle Tone: within normal limits Gait & Station: normal mobilizes in wheelchair at times, but is noted to be able to ambulate/walk at times without difficulty Patient leans: N/A  Psychiatric Specialty Exam: Physical Exam  Review of Systems intermittent headache, chronic back pain, right wrist pain.  No nausea, no vomiting  Blood pressure 123/90, pulse (!) 105, temperature 98.1 F (36.7 C), resp. rate 16, height 5' 2.99" (1.6 m), weight 114.3  kg, SpO2 100 %.Body mass index is 44.65 kg/m.  General Appearance: Improving grooming  Eye Contact:  Good  Speech:  Normal  Volume:  Normal  Mood:  partially, gradually improving mood   Affect:  vaguely constricted but more reactive than on admission, smiles at times appropriately during session  Thought Process:  Linear and Descriptions of Associations: Intact  Orientation:  Other:  Fully alert and attentive  Thought Content:  No hallucinations, no delusions, somatic preoccupations have decreased in intensity  Suicidal Thoughts:  No currently denies suicidal or self-injurious ideations and contracts for safety on unit at this time  Homicidal Thoughts:  Yes.  without intent/plan reports vague HI towards her father , without plan or intention  Memory:  Recent and remote grossly intact  Judgement:  improving  Insight:  Fair  Psychomotor Activity:  Normal-no psychomotor agitation or restlessness noted or reported  Concentration:  Concentration: Good and Attention Span: Good  Recall:  Good  Fund of Knowledge:  Good  Language:  Good  Akathisia:  Negative  Handed:  Right  AIMS (if indicated):     Assets:  Communication Skills Desire for Improvement Resilience  ADL's:  Intact  Cognition:  WNL  Sleep:  Number of Hours: 6.75   Assessment-  23 year old female, lives with sister.  Presented to ED reporting worsening depression, suicidal thoughts and recent overdose on Motrin, Naprosyn, Tylenol, Advil, aspirin.  Reports she talks about 2 of each in the evening and then 2 more beach in the morning.  States that intention was to relieve pain but also acknowledges recent SI as well as neurovegetative symptoms of depression.  History of prior psychiatric admission in 2019 for overdose.  Prior history of PTSD diagnosis. Reports chronic pain times several months affecting chest and back mainly.   Patient has improved partially - she acknowledges improving mood, affect remains constricted but is now  more reactive, and brightens during interactions, and intensity of somatic concerns has decreased, although continues to report pain affecting back and wrist. Appears calm and comfortable and not in any acute distress . Denies SI. Of  note today reports vague HI ( without plan or intention)  towards father, who lives in [place] , and whom she is angry with as she states he threatened to have her committed after she is discharged . No homicidal  plan or intention . Tolerating medications well thus far. .  Treatment Plan Summary: Daily contact with patient to assess and evaluate symptoms and progress in treatment, Medication management, Plan Inpatient treatment and Medications as below  Treatment Plan reviewed as below today 12/14 Encourage group and milieu participation Continue Neurontin  300 mg 3 times daily for pain and anxiety Increase Cymbalta to 60 mg daily for depression, pain Continue Lidoderm patch to affected area of back for analgesia/back pain. Discontinue Ultram  Continue Protonix and Carafate to address potential GERD symptoms Continue Ativan 0.5 mg every 6 hours as needed for anxiety as needed Continue Miralax/Glycolax for constipation as needed. Treatment team working on disposition planning options- consider discharge soon as she continues to stabilize / improve  Jenne Campus, MD 12/28/2018, 3:10 PM    Patient ID: Sima Matas, female   DOB: January 20, 1995, 23 y.o.   MRN: [id number]

## 2018-12-28 NOTE — Progress Notes (Signed)
Recreation Therapy Notes  Date:  12.14.20 Time: 0930 Location: [address] Dayroom  Group Topic: Stress Management  Goal Area(s) Addresses:  Patient will identify positive stress management techniques. Patient will identify benefits of using stress management post d/c.  Intervention: Stress Management  Activity : Meditation.  LRT played a meditation that focused on being resilient in the face of adversity.  Patients were to listen and follow along as meditation played to fully engage.   Education:  Stress Management, Discharge Planning.   Education Outcome: Acknowledges Education  Clinical Observations/Feedback: Pt did not attend group session.    Victorino Sparrow, LRT/CTRS         Victorino Sparrow A 12/28/2018 12:36 PM

## 2018-12-28 NOTE — [facility] Group Notes (Signed)
Type of Therapy and Topic: Group Therapy: Core Beliefs  Participation Level: Active  Description of Group: In this group patients will be encouraged to explore their negative and positive core beliefs about themselves, others, and the world. Each patient will be challenged to identify these beliefs and ways to challenge negative core beliefs. This group will be process-oriented, with patients participating in exploration of their own experiences as well as giving and receiving support and challenge from other group members.  Therapeutic Goals: 1. Patient will identify personal core beliefs, both negative and positive. 2. Patient will identify core beliefs relating to others, both negative and positive. 3. Patient will challenge their negative beliefs about themselves and others. 4. Patient will identify three changes they can make to replace negative core beliefs with positive beliefs.  Summary of Patient Progress  Due to the COVID-19 pandemic, this group has been supplemented with worksheets.   Goku Harb, MSW, LCSW Clinical Social Worker [facility]  Phone: [phone]  

## 2018-12-28 NOTE — Progress Notes (Signed)
Adult Psychoeducational Group Note  Date:  12/28/2018 Time:  9:12 AM  Group Topic/Focus:  Wellness Toolbox:   The focus of this group is to discuss various aspects of wellness, balancing those aspects and exploring ways to increase the ability to experience wellness.  Patients will create a wellness toolbox for use upon discharge.  Participation Level:  Did Not Attend  Pt was informed that group with the [facility] will begin. Pt chose not to attend group.   [name] 12/28/2018, 9:12 AM

## 2018-12-29 MED ORDER — SUCRALFATE 1 G PO TABS: 1.0000 g | ORAL_TABLET | Freq: Three times a day (TID) | ORAL | 0 refills | Status: AC

## 2018-12-29 MED ORDER — METFORMIN HCL 500 MG PO TABS: 500.0000 mg | ORAL_TABLET | Freq: Every day | ORAL | 0 refills | Status: AC

## 2018-12-29 MED ORDER — PANTOPRAZOLE SODIUM 40 MG PO TBEC: 40.0000 mg | DELAYED_RELEASE_TABLET | Freq: Every day | ORAL | 0 refills | Status: AC

## 2018-12-29 MED ORDER — LIDOCAINE 5 % EX PTCH: 1.0000 | MEDICATED_PATCH | CUTANEOUS | 0 refills | Status: AC

## 2018-12-29 MED ORDER — GABAPENTIN 300 MG PO CAPS: 300.0000 mg | ORAL_CAPSULE | Freq: Three times a day (TID) | ORAL | 0 refills | Status: AC

## 2018-12-29 MED ORDER — DULOXETINE HCL 60 MG PO CPEP: 60.0000 mg | ORAL_CAPSULE | Freq: Every day | ORAL | 0 refills | Status: AC

## 2018-12-29 NOTE — Progress Notes (Signed)
  [facility] Adult Case Management Discharge Plan :  Will you be returning to the same living situation after discharge:  Yes,  patient is returning home with her family At discharge, do you have transportation home?: Yes,  patient's friend will transport her home Do you have the ability to pay for your medications: Yes,  BCBS  Release of information consent forms completed and in the chart;  Patient's signature needed at discharge.  Patient to Follow up at: Follow-up Information    [facility] Follow up on 12/31/2018.   Why: You are scheduled for a telephone appointment on Thursday, December 17th at 9:00am. Please have your discharge summary available.  You will receive a phone call at [phone]. Contact information: [place] [address] [phone]        [facility] [facility] Follow up on 12/30/2018.   Specialty: Behavioral Health Why: Your initial appointment will be Wednesday, 12/30/2018 at 10:00am. Please be sure to check your email for the WebEx link. If you have any additional questions, please call number listed.  Contact information: [place] [id number] [place] [phone]          Next level of care provider has access to [place] and Suicide Prevention discussed: Yes,  with the patient's mother  Have you used any form of tobacco in the last 30 days? (Cigarettes, Smokeless Tobacco, Cigars, and/or Pipes): Yes  Has patient been referred to the Quitline?: N/A patient is not a smoker  Patient has been referred for addiction treatment: [place], [place] 12/29/2018, 11:12 AM
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
## 2018-12-29 NOTE — Progress Notes (Signed)
   12/29/18 0116  Psych Admission Type (Psych Patients Only)  Admission Status Voluntary  Psychosocial Assessment  Patient Complaints None  Eye Contact Fair  Facial Expression Animated;Anxious  Affect Anxious;Appropriate to circumstance  Speech Logical/coherent  Interaction Assertive;Childlike  Motor Activity Fidgety  Appearance/Hygiene Unremarkable  Behavior Characteristics Intrusive  Mood Preoccupied;Anxious  Thought Process  Coherency WDL  Content Ambivalence;Hypochondria;Blaming others  Delusions None reported or observed  Perception WDL  Hallucination None reported or observed  Judgment Limited  Confusion None  Danger to Self  Current suicidal ideation? Denies  Danger to Others  Danger to Others None reported or observed  D: Patient in dayroom interacting with peers. Pt reports multiple somatic concerns.  A: Medications administered as prescribed. Support and encouragement provided as needed.  R: Patient remains safe on the unit. Will continue to monitor for safety and stability.

## 2018-12-29 NOTE — BHH Suicide Risk Assessment (Signed)
[facility] Discharge Suicide Risk Assessment   Principal Problem: MDD (major depressive disorder) Discharge Diagnoses: Principal Problem:   MDD (major depressive disorder)   Total Time spent with patient: 15 minutes  Musculoskeletal: Strength & Muscle Tone: within normal limits Gait & Station: normal Patient leans: N/A  Psychiatric Specialty Exam: Review of Systems  All other systems reviewed and are negative.   Blood pressure (!) 150/114, pulse (!) 104, temperature 97.6 F (36.4 C), temperature source Oral, resp. rate 16, height 5' 2.99" (1.6 m), weight 114.3 kg, SpO2 100 %.Body mass index is 44.65 kg/m.  General Appearance: Casual  Eye Contact::  Fair  Speech:  Normal Rate409  Volume:  Normal  Mood:  Euthymic  Affect:  Congruent  Thought Process:  Coherent and Descriptions of Associations: Intact  Orientation:  Full (Time, Place, and Person)  Thought Content:  Logical  Suicidal Thoughts:  No  Homicidal Thoughts:  No  Memory:  Immediate;   Fair Recent;   Fair Remote;   Fair  Judgement:  Intact  Insight:  Fair  Psychomotor Activity:  Normal  Concentration:  Fair  Recall:  [employer] of Knowledge:Fair  Language: Good  Akathisia:  Negative  Handed:  Right  AIMS (if indicated):     Assets:  Desire for Improvement Resilience  Sleep:  Number of Hours: 6.75  Cognition: WNL  ADL's:  Intact   Mental Status Per Nursing Assessment::   On Admission:  Suicidal ideation indicated by patient, Self-harm thoughts  Demographic Factors:  Low socioeconomic status  Loss Factors: NA  Historical Factors: Impulsivity  Risk Reduction Factors:   Positive coping skills or problem solving skills  Continued Clinical Symptoms:  Depression:   Impulsivity  Cognitive Features That Contribute To Risk:  None    Suicide Risk:  Minimal: No identifiable suicidal ideation.  Patients presenting with no risk factors but with morbid ruminations; may be classified as minimal risk based on  the severity of the depressive symptoms  Follow-up Information    [facility] Follow up on 12/31/2018.   Why: You are scheduled for a telephone appointment on Thursday, December 17th at 9:00am.   Please have your discharge summary available.  You will receive a phone call at [phone]. Contact information: [address] [place] [address] [phone]           Plan Of Care/Follow-up recommendations:  Activity:  ad lib  Sharma Covert, MD 12/29/2018, 7:57 AM

## 2018-12-29 NOTE — Progress Notes (Signed)
Pt d/c from the hospital with a friend. All items returned. D/C instructions given and samples given.

## 2018-12-30 ENCOUNTER — Other Ambulatory Visit: Payer: Self-pay

## 2018-12-30 ENCOUNTER — Other Ambulatory Visit (HOSPITAL_COMMUNITY): Payer: BC Managed Care – PPO

## 2018-12-30 ENCOUNTER — Encounter (HOSPITAL_COMMUNITY): Payer: Self-pay | Admitting: *Deleted

## 2018-12-30 ENCOUNTER — Emergency Department (HOSPITAL_COMMUNITY)
Admission: EM | Admit: 2018-12-30 | Discharge: 2018-12-31 | Disposition: A | Discharge status: Home / Self Care | Payer: BC Managed Care – PPO | Attending: Emergency Medicine | Admitting: Emergency Medicine

## 2018-12-30 DIAGNOSIS — R109 Unspecified abdominal pain: Secondary | ICD-10-CM | POA: Diagnosis not present

## 2018-12-30 DIAGNOSIS — R55 Syncope and collapse: Secondary | ICD-10-CM | POA: Diagnosis not present

## 2018-12-30 DIAGNOSIS — M25512 Pain in left shoulder: Secondary | ICD-10-CM | POA: Diagnosis not present

## 2018-12-30 DIAGNOSIS — Y939 Activity, unspecified: Secondary | ICD-10-CM | POA: Diagnosis not present

## 2018-12-30 DIAGNOSIS — M542 Cervicalgia: Secondary | ICD-10-CM | POA: Diagnosis present

## 2018-12-30 DIAGNOSIS — F909 Attention-deficit hyperactivity disorder, unspecified type: Secondary | ICD-10-CM | POA: Diagnosis not present

## 2018-12-30 DIAGNOSIS — Z7722 Contact with and (suspected) exposure to environmental tobacco smoke (acute) (chronic): Secondary | ICD-10-CM | POA: Insufficient documentation

## 2018-12-30 DIAGNOSIS — Y929 Unspecified place or not applicable: Secondary | ICD-10-CM | POA: Diagnosis not present

## 2018-12-30 DIAGNOSIS — R0789 Other chest pain: Secondary | ICD-10-CM | POA: Diagnosis not present

## 2018-12-30 DIAGNOSIS — S161XXA Strain of muscle, fascia and tendon at neck level, initial encounter: Secondary | ICD-10-CM | POA: Insufficient documentation

## 2018-12-30 DIAGNOSIS — M7918 Myalgia, other site: Secondary | ICD-10-CM

## 2018-12-30 DIAGNOSIS — R519 Headache, unspecified: Secondary | ICD-10-CM | POA: Insufficient documentation

## 2018-12-30 DIAGNOSIS — Z79899 Other long term (current) drug therapy: Secondary | ICD-10-CM | POA: Insufficient documentation

## 2018-12-30 DIAGNOSIS — Y999 Unspecified external cause status: Secondary | ICD-10-CM | POA: Diagnosis not present

## 2018-12-30 LAB — COMPREHENSIVE METABOLIC PANEL
ALT: 30 U/L (ref 0–44)
AST: 33 U/L (ref 15–41)
Albumin: 3.5 g/dL (ref 3.5–5.0)
Alkaline Phosphatase: 62 U/L (ref 38–126)
Anion gap: 7 (ref 5–15)
BUN: 10 mg/dL (ref 6–20)
CO2: 26 mmol/L (ref 22–32)
Calcium: 8.9 mg/dL (ref 8.9–10.3)
Chloride: 105 mmol/L (ref 98–111)
Creatinine, Ser: 0.82 mg/dL (ref 0.44–1.00)
GFR calc Af Amer: >60 mL/min (ref >60)
GFR calc non Af Amer: >60 mL/min (ref >60)
Glucose, Bld: 98 mg/dL (ref 70–99)
Potassium: 4 mmol/L (ref 3.5–5.1)
Sodium: 138 mmol/L (ref 135–145)
Total Bilirubin: 0.3 mg/dL (ref 0.3–1.2)
Total Protein: 7.7 g/dL (ref 6.5–8.1)

## 2018-12-30 LAB — CBC
HCT: 37.5 % (ref 36.0–46.0)
Hemoglobin: 12.3 g/dL (ref 12.0–15.0)
MCH: 29.3 pg (ref 26.0–34.0)
MCHC: 32.8 g/dL (ref 30.0–36.0)
MCV: 89.3 fL (ref 80.0–100.0)
Platelets: 251 10*3/uL (ref 150–400)
RBC: 4.2 MIL/uL (ref 3.87–5.11)
RDW: 13.5 % (ref 11.5–15.5)
WBC: 6.5 10*3/uL (ref 4.0–10.5)
nRBC: 0 % (ref 0.0–0.2)

## 2018-12-30 LAB — I-STAT BETA HCG BLOOD, ED (MC, WL, AP ONLY): I-stat hCG, quantitative: <5 m[IU]/mL (ref <5)

## 2018-12-30 MED ORDER — SODIUM CHLORIDE 0.9% FLUSH
3.0000 mL | Freq: Once | INTRAVENOUS | Status: AC
  Administered 2018-12-31: 3 mL via INTRAVENOUS
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
## 2018-12-31 ENCOUNTER — Emergency Department (HOSPITAL_COMMUNITY): Payer: BC Managed Care – PPO

## 2018-12-31 LAB — URINALYSIS, ROUTINE W REFLEX MICROSCOPIC
Bilirubin Urine: NEGATIVE
Glucose, UA: NEGATIVE mg/dL
Hgb urine dipstick: NEGATIVE
Ketones, ur: NEGATIVE mg/dL
Nitrite: NEGATIVE
Protein, ur: NEGATIVE mg/dL
Specific Gravity, Urine: 1.02 (ref 1.005–1.030)
pH: 7 (ref 5.0–8.0)

## 2018-12-31 MED ORDER — METHOCARBAMOL 500 MG PO TABS: 500.0000 mg | ORAL_TABLET | Freq: Three times a day (TID) | ORAL | 0 refills | Status: AC | PRN

## 2018-12-31 MED ORDER — ONDANSETRON HCL 4 MG/2ML IJ SOLN
4.0000 mg | Freq: Once | INTRAMUSCULAR | Status: AC
  Administered 2018-12-31: 01:00:00 4 mg via INTRAVENOUS
  Filled 2018-12-31: qty 2

## 2018-12-31 MED ORDER — TRAMADOL HCL 50 MG PO TABS: 50.0000 mg | ORAL_TABLET | Freq: Four times a day (QID) | ORAL | 0 refills | Status: AC | PRN

## 2018-12-31 MED ORDER — IOHEXOL 300 MG/ML  SOLN
100.0000 mL | Freq: Once | INTRAMUSCULAR | Status: AC | PRN
  Administered 2018-12-31: 01:00:00 100 mL via INTRAVENOUS

## 2018-12-31 MED ORDER — SODIUM CHLORIDE 0.9 % IV BOLUS
1000.0000 mL | Freq: Once | INTRAVENOUS | Status: AC
  Administered 2018-12-31: 01:00:00 1000 mL via INTRAVENOUS

## 2018-12-31 MED ORDER — MORPHINE SULFATE (PF) 4 MG/ML IV SOLN
4.0000 mg | Freq: Once | INTRAVENOUS | Status: AC
  Administered 2018-12-31: 01:00:00 4 mg via INTRAVENOUS
  Filled 2018-12-31: qty 1

## 2018-12-31 NOTE — Progress Notes (Signed)
Orthopedic Tech Progress Note Patient Details:  Tina Hale 1995-03-02 [id number]  Ortho Devices Type of Ortho Device: Arm sling Ortho Device/Splint Location: lue Ortho Device/Splint Interventions: Ordered, Application, Adjustment   Post Interventions Patient Tolerated: Well Instructions Provided: Care of device, Adjustment of device   Karolee Stamps 12/31/2018, 3:29 AM

## 2018-12-31 NOTE — ED Notes (Signed)
Ortho tech paged  
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
## 2018-12-31 NOTE — ED Notes (Signed)
Discharge instructions reviewed with pt. Pt verbalized understanding.   

## 2018-12-31 NOTE — ED Notes (Signed)
Patient transported to CT 

## 2019-06-14 IMAGING — DX DG CHEST 2V
2 series · 2 of 2 positions shown · non-contrast
Comparison: CT chest 05/06/2016

Chest radiograph 05/06/2016

CLINICAL DATA: Chest pain

EXAM:
CHEST  2 VIEW

[chest pa]
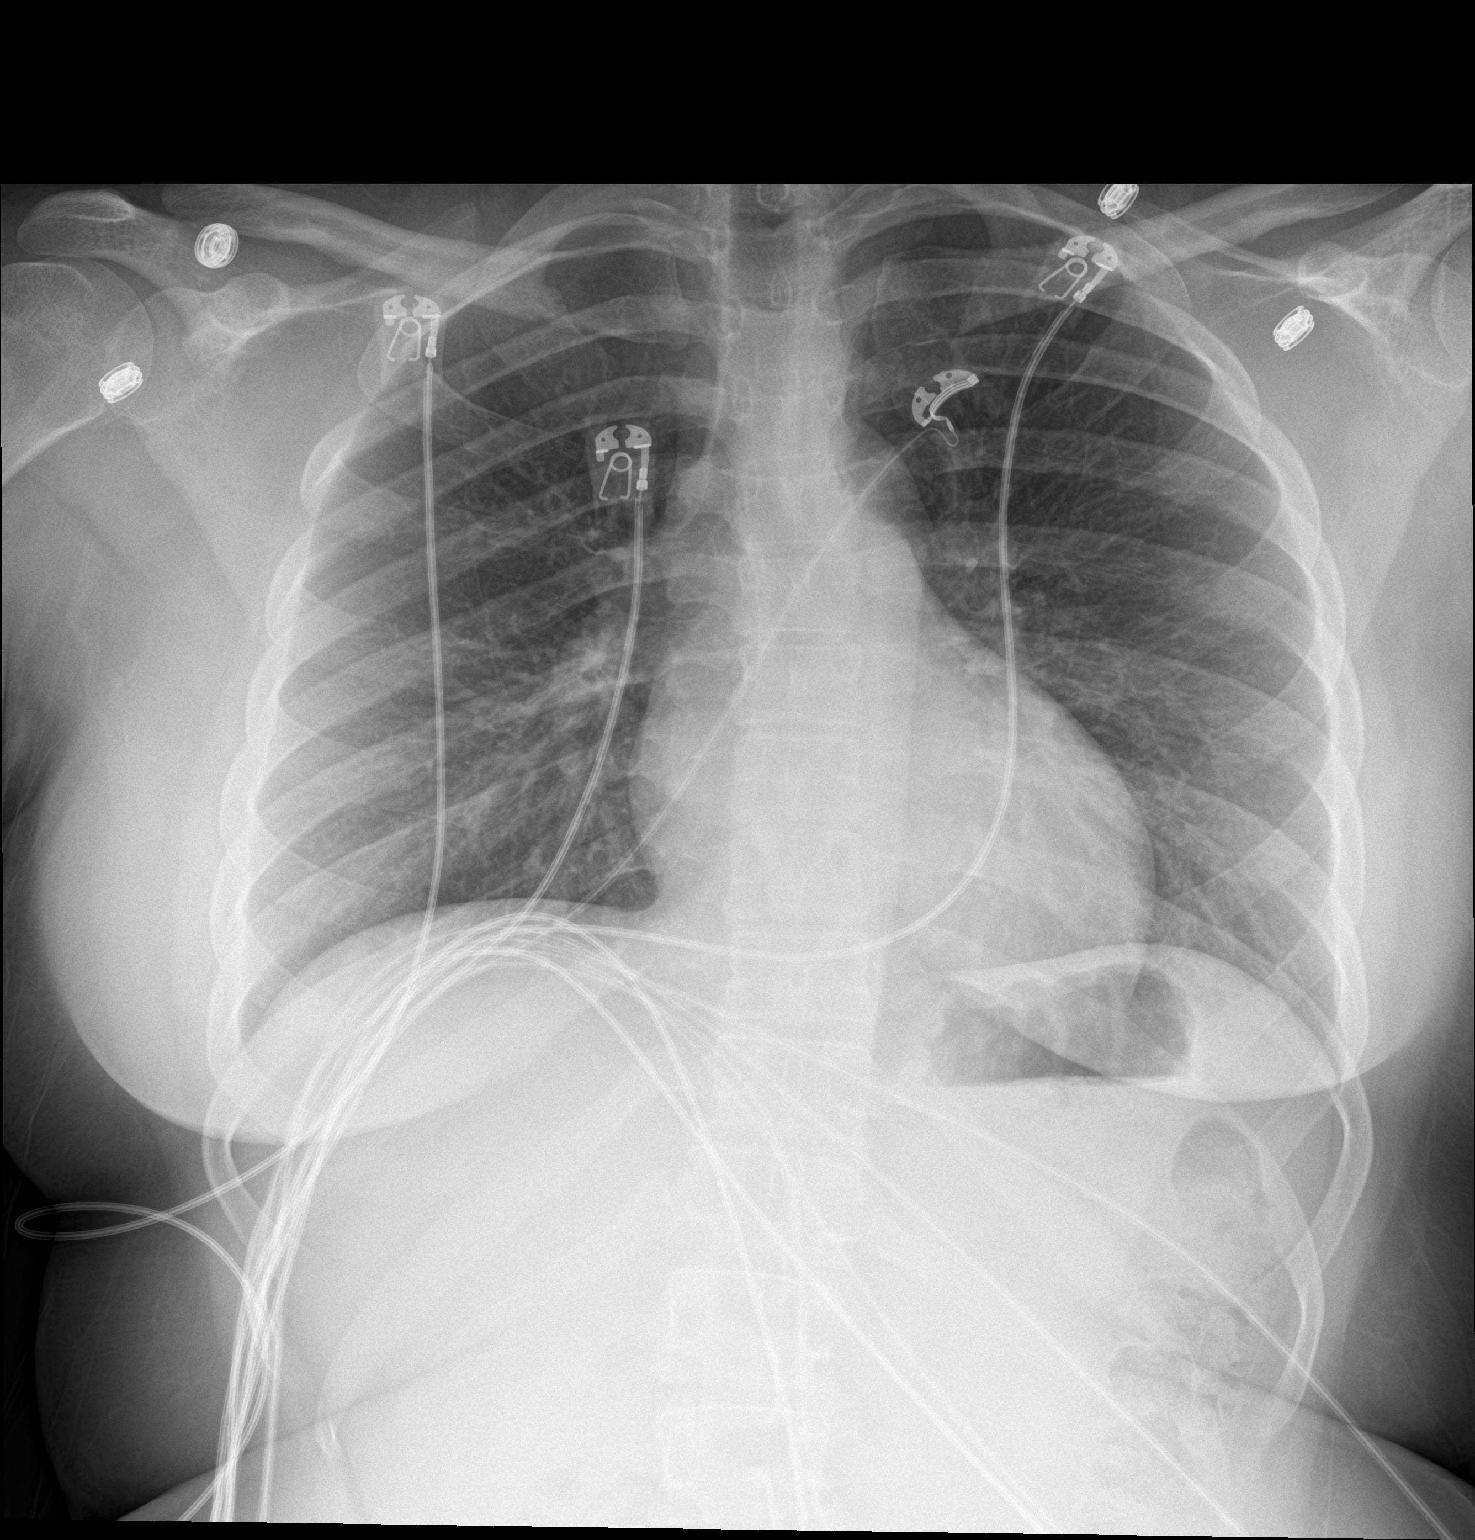

[chest lat]
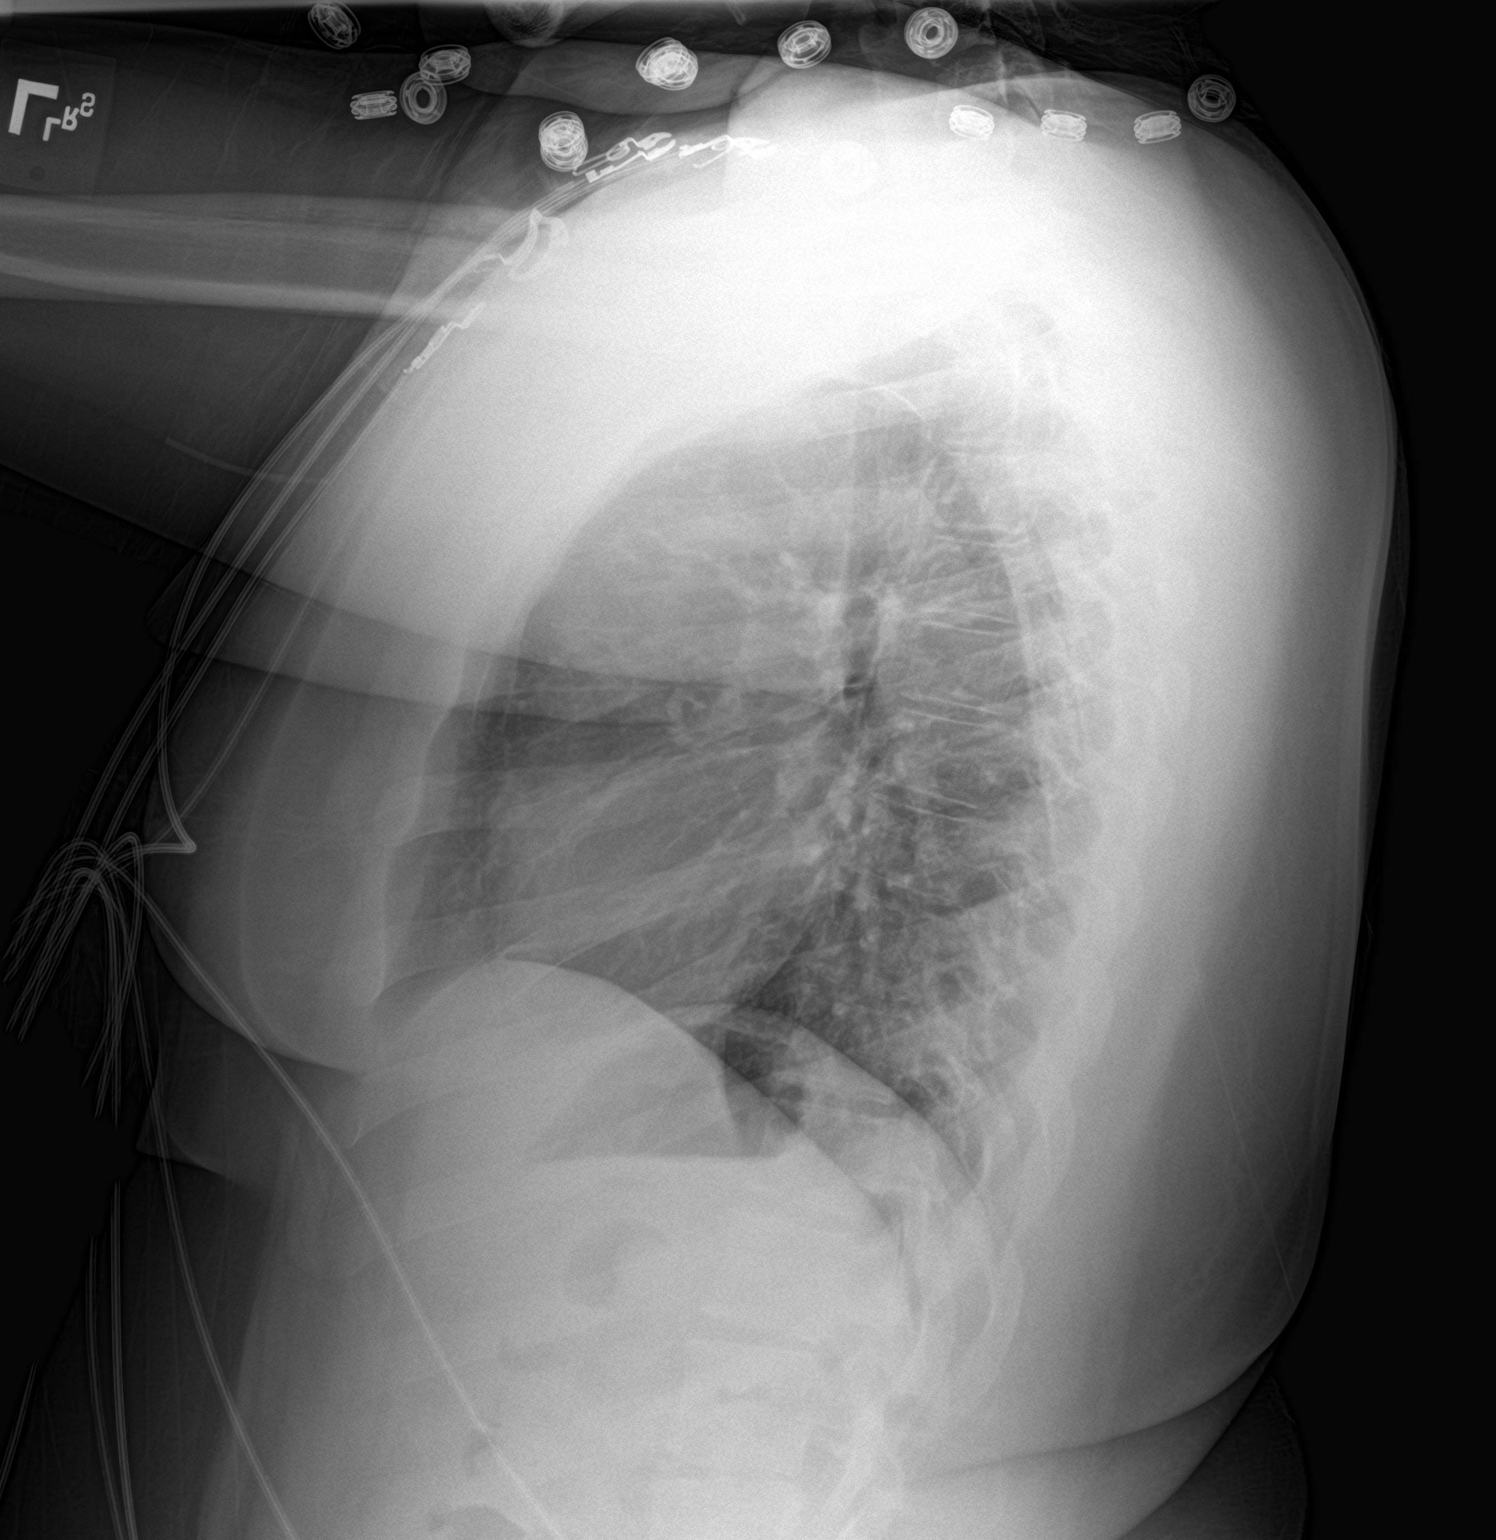

[2 of 2 positions shown; findings below may reference images not displayed]

FINDINGS: The heart size and mediastinal contours are within normal limits.
Both lungs are clear. The visualized skeletal structures are
unremarkable.
IMPRESSION: No active cardiopulmonary disease.

## 2019-06-18 IMAGING — CR DG ABDOMEN 2V
3 series · 3 of 3 positions shown · non-contrast
Comparison: None.

CLINICAL DATA: Abdominal pain.  Nausea and vomiting.

EXAM:
ABDOMEN - 2 VIEW

[w abdomen upright]
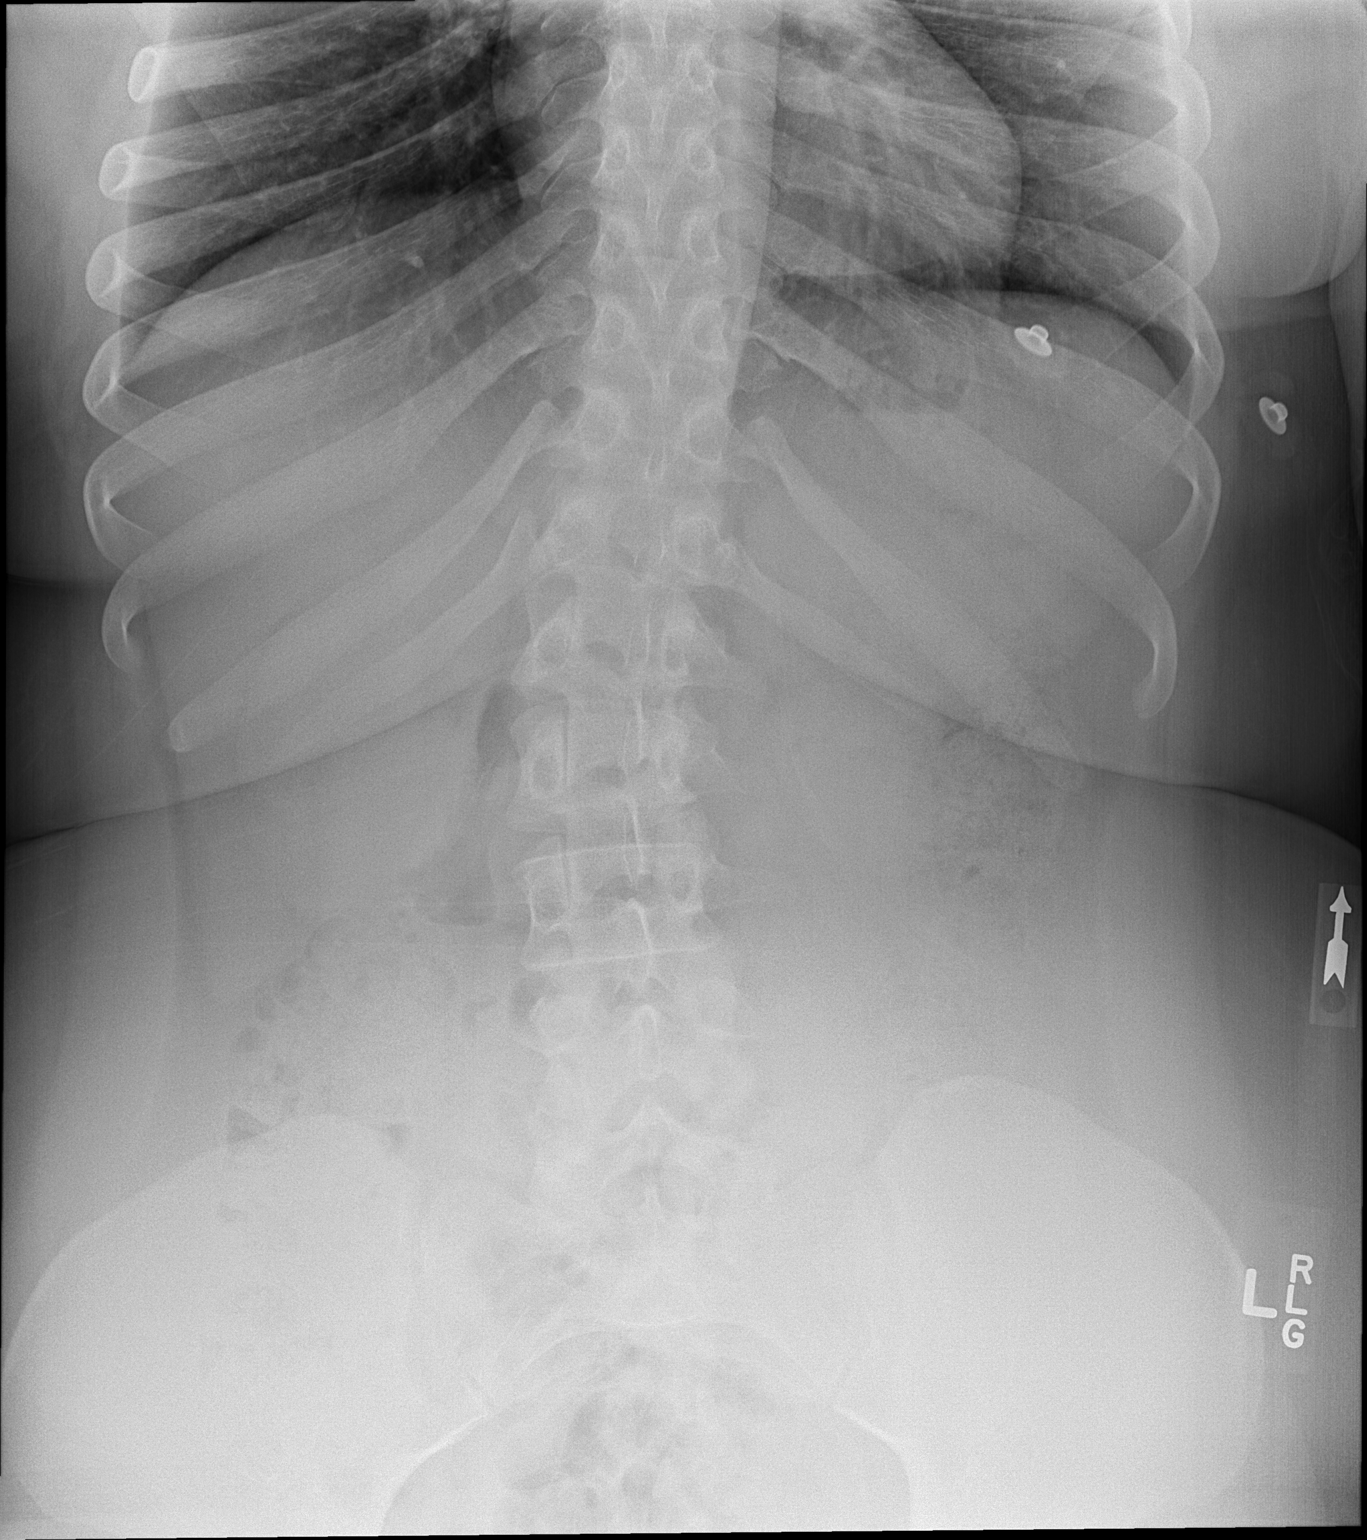

[t abdomen supine (1 of 2)]
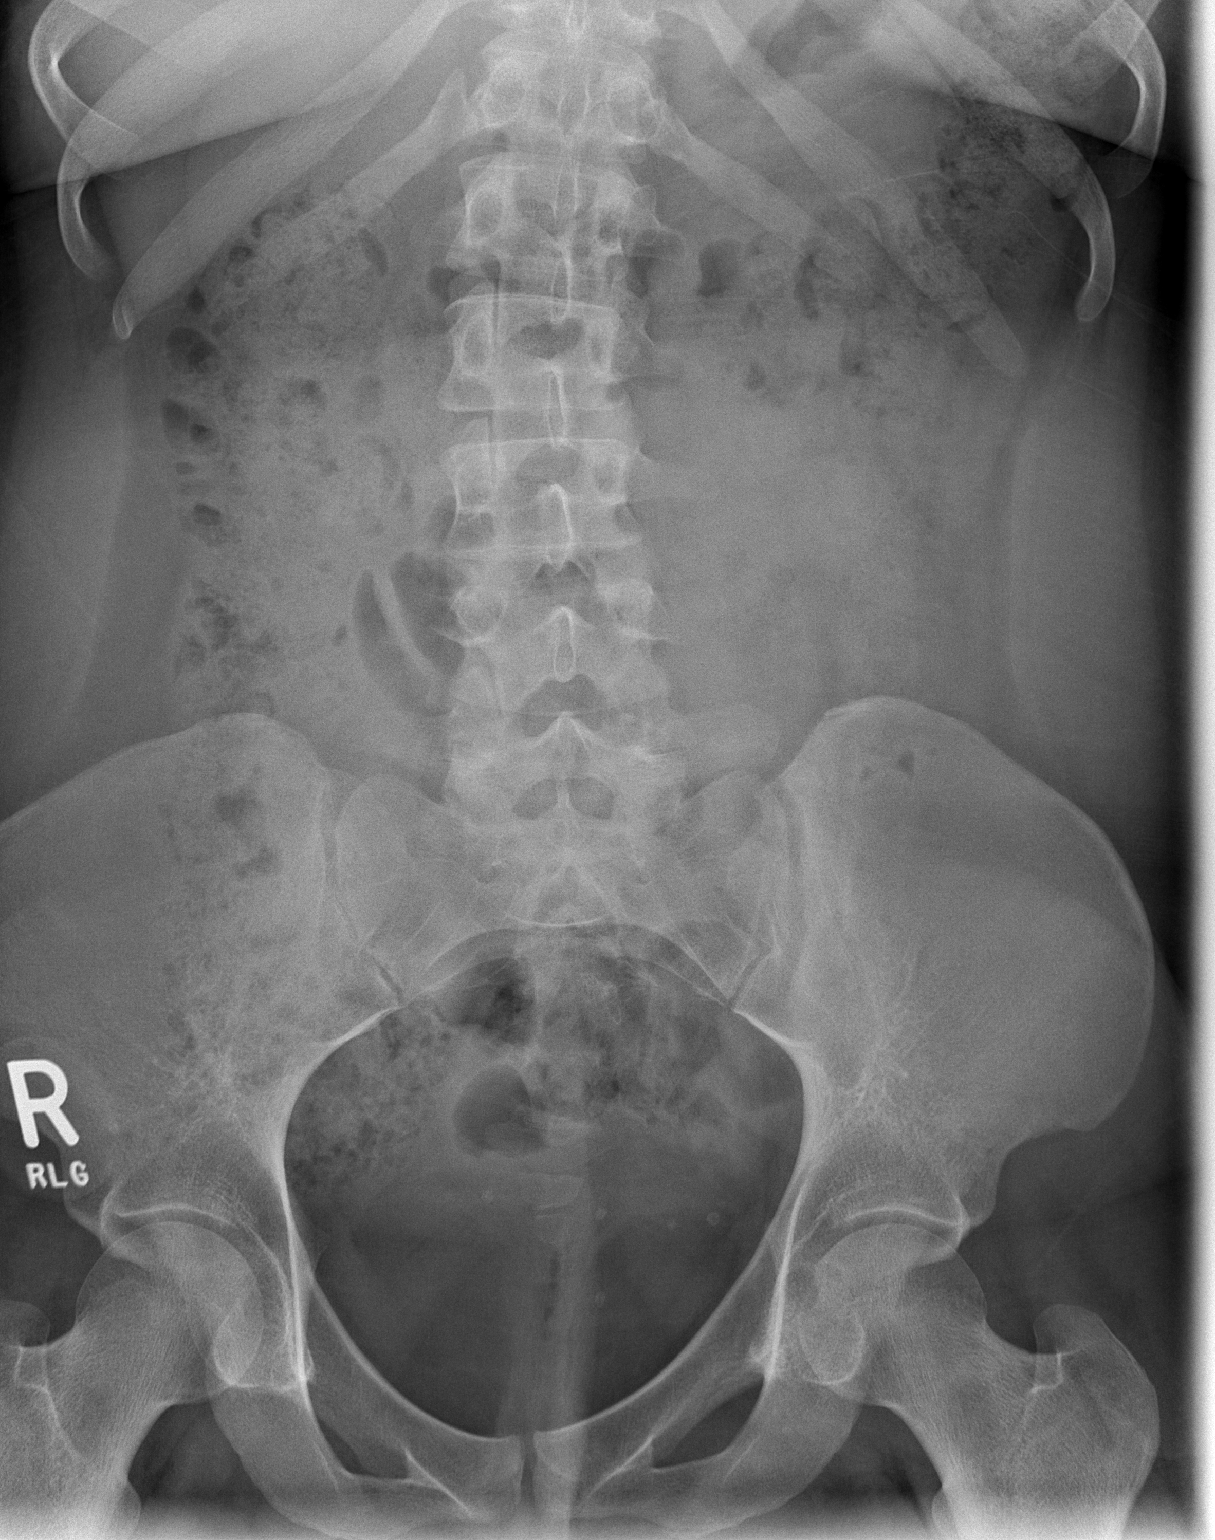

[t abdomen supine (2 of 2)]
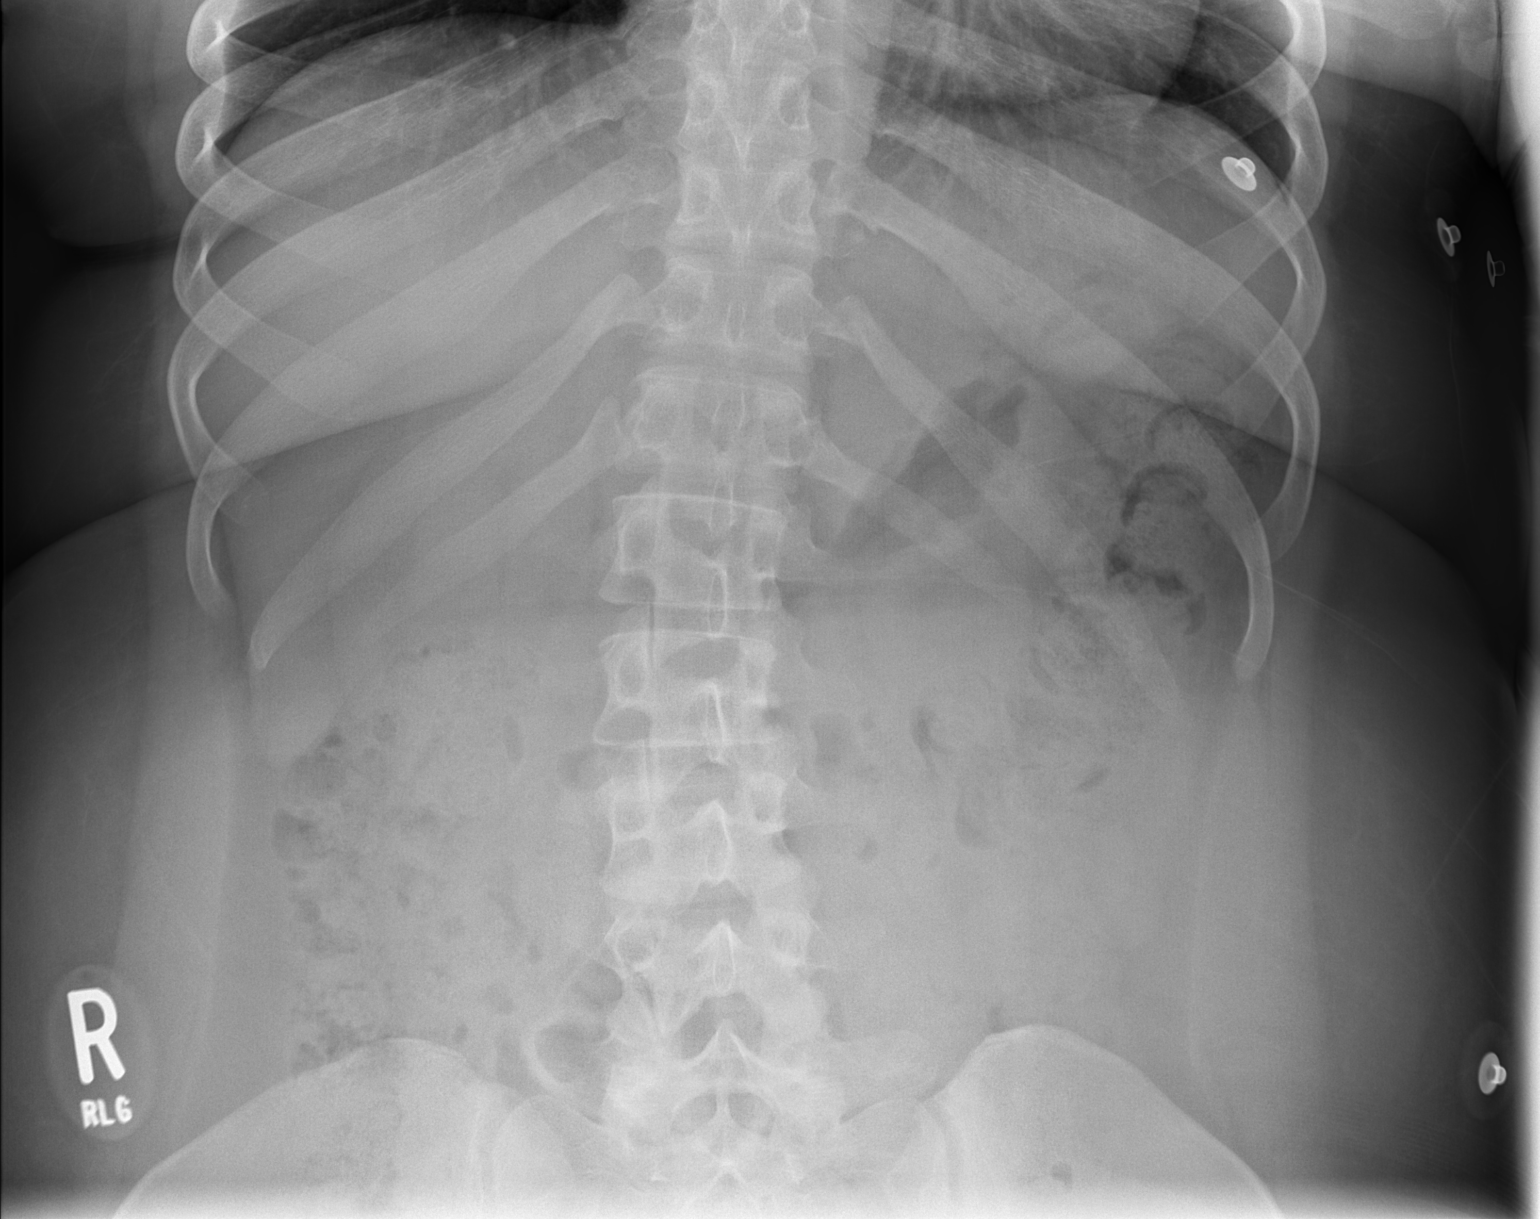

[3 of 3 positions shown; findings below may reference images not displayed]

FINDINGS: The lung bases are clear. There is no free intra-abdominal air. No
dilated bowel loops to suggest obstruction. Moderate volume of stool
throughout the colon. There are pelvic phleboliths. No radiopaque
calculi. No acute osseous abnormalities are seen.
IMPRESSION: Moderate colonic stool burden, can be seen with constipation. No
bowel obstruction. No free air.

## 2020-07-11 IMAGING — DX DG WRIST COMPLETE 3+V*R*
1 series · 1 of 1 positions shown · non-contrast
Comparison: None.

CLINICAL DATA: Basketball injury

EXAM:
RIGHT WRIST - COMPLETE 3+ VIEW

[wrist ap]
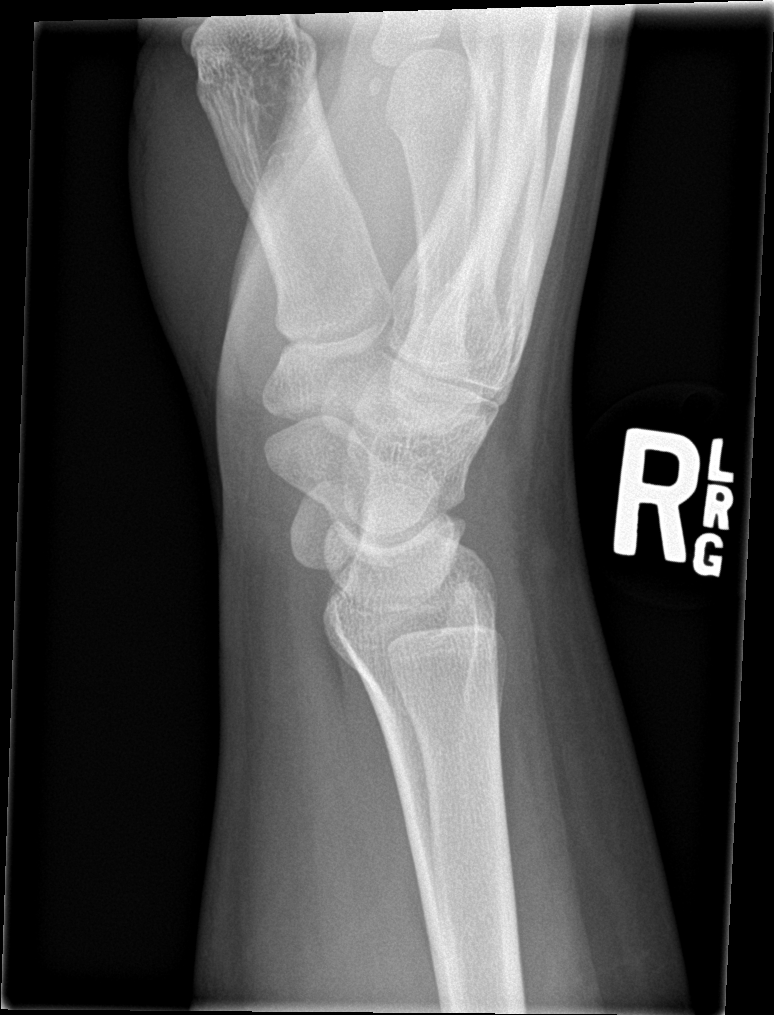

[1 of 1 positions shown; findings below may reference images not displayed]

FINDINGS: There is no evidence of fracture or dislocation. There is no
evidence of arthropathy or other focal bone abnormality. Soft
tissues are unremarkable.
IMPRESSION: Negative.
